# Patient Record
Sex: Male | Born: 1950 | Race: White | Hispanic: No | Marital: Married | Smoking: Former smoker
Health system: Southern US, Community
[De-identification: ages and names within clinical notes are randomized; demographics above are authoritative.]

## PROBLEM LIST (undated history)

## (undated) DIAGNOSIS — K219 Gastro-esophageal reflux disease without esophagitis: Secondary | ICD-10-CM

## (undated) DIAGNOSIS — K5790 Diverticulosis of intestine, part unspecified, without perforation or abscess without bleeding: Secondary | ICD-10-CM

## (undated) DIAGNOSIS — G4489 Other headache syndrome: Principal | ICD-10-CM

## (undated) DIAGNOSIS — H269 Unspecified cataract: Secondary | ICD-10-CM

## (undated) DIAGNOSIS — T7840XA Allergy, unspecified, initial encounter: Secondary | ICD-10-CM

## (undated) DIAGNOSIS — K2970 Gastritis, unspecified, without bleeding: Secondary | ICD-10-CM

## (undated) DIAGNOSIS — M1990 Unspecified osteoarthritis, unspecified site: Secondary | ICD-10-CM

## (undated) DIAGNOSIS — K449 Diaphragmatic hernia without obstruction or gangrene: Secondary | ICD-10-CM

## (undated) HISTORY — DX: Gastritis, unspecified, without bleeding: K29.70

## (undated) HISTORY — PX: HERNIA REPAIR: SHX51

## (undated) HISTORY — DX: Other headache syndrome: G44.89

## (undated) HISTORY — DX: Diverticulosis of intestine, part unspecified, without perforation or abscess without bleeding: K57.90

## (undated) HISTORY — PX: UPPER GASTROINTESTINAL ENDOSCOPY: SHX188

## (undated) HISTORY — DX: Unspecified cataract: H26.9

## (undated) HISTORY — DX: Diaphragmatic hernia without obstruction or gangrene: K44.9

## (undated) HISTORY — PX: APPENDECTOMY: SHX54

## (undated) HISTORY — PX: COLONOSCOPY: SHX174

## (undated) HISTORY — DX: Gastro-esophageal reflux disease without esophagitis: K21.9

## (undated) HISTORY — DX: Allergy, unspecified, initial encounter: T78.40XA

## (undated) HISTORY — DX: Unspecified osteoarthritis, unspecified site: M19.90

---
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
## 2004-12-07 ENCOUNTER — Ambulatory Visit (HOSPITAL_COMMUNITY): Admission: RE | Admit: 2004-12-07 | Discharge: 2004-12-07 | Payer: Self-pay | Admitting: Family Medicine

## 2011-02-03 HISTORY — PX: FACIAL FRACTURE SURGERY: SHX1570

## 2011-02-14 ENCOUNTER — Emergency Department (HOSPITAL_COMMUNITY)
Admission: EM | Admit: 2011-02-14 | Discharge: 2011-02-15 | Disposition: A | Discharge status: Home / Self Care | Payer: PRIVATE HEALTH INSURANCE | Attending: Emergency Medicine | Admitting: Emergency Medicine

## 2011-02-14 DIAGNOSIS — R51 Headache: Secondary | ICD-10-CM | POA: Insufficient documentation

## 2011-02-14 DIAGNOSIS — S0120XA Unspecified open wound of nose, initial encounter: Secondary | ICD-10-CM | POA: Insufficient documentation

## 2011-02-14 DIAGNOSIS — S02109A Fracture of base of skull, unspecified side, initial encounter for closed fracture: Secondary | ICD-10-CM | POA: Insufficient documentation

## 2011-02-14 DIAGNOSIS — S022XXA Fracture of nasal bones, initial encounter for closed fracture: Secondary | ICD-10-CM | POA: Insufficient documentation

## 2011-02-15 ENCOUNTER — Emergency Department (HOSPITAL_COMMUNITY): Payer: PRIVATE HEALTH INSURANCE

## 2011-02-18 ENCOUNTER — Encounter (HOSPITAL_BASED_OUTPATIENT_CLINIC_OR_DEPARTMENT_OTHER)
Admission: RE | Admit: 2011-02-18 | Discharge: 2011-02-18 | Disposition: A | Discharge status: Home / Self Care | Payer: PRIVATE HEALTH INSURANCE | Source: Ambulatory Visit | Attending: Otolaryngology | Admitting: Otolaryngology

## 2011-02-18 DIAGNOSIS — Z01818 Encounter for other preprocedural examination: Secondary | ICD-10-CM | POA: Insufficient documentation

## 2011-02-18 DIAGNOSIS — Z01812 Encounter for preprocedural laboratory examination: Secondary | ICD-10-CM | POA: Insufficient documentation

## 2011-02-18 DIAGNOSIS — Z01810 Encounter for preprocedural cardiovascular examination: Secondary | ICD-10-CM | POA: Insufficient documentation

## 2011-02-20 ENCOUNTER — Ambulatory Visit (HOSPITAL_BASED_OUTPATIENT_CLINIC_OR_DEPARTMENT_OTHER)
Admission: RE | Admit: 2011-02-20 | Discharge: 2011-02-20 | Disposition: A | Discharge status: Home / Self Care | Payer: PRIVATE HEALTH INSURANCE | Source: Ambulatory Visit | Attending: Otolaryngology | Admitting: Otolaryngology

## 2011-02-20 DIAGNOSIS — S02400A Malar fracture unspecified, initial encounter for closed fracture: Secondary | ICD-10-CM | POA: Insufficient documentation

## 2011-02-20 DIAGNOSIS — J449 Chronic obstructive pulmonary disease, unspecified: Secondary | ICD-10-CM | POA: Insufficient documentation

## 2011-02-20 DIAGNOSIS — Y998 Other external cause status: Secondary | ICD-10-CM | POA: Insufficient documentation

## 2011-02-20 DIAGNOSIS — Z01810 Encounter for preprocedural cardiovascular examination: Secondary | ICD-10-CM | POA: Insufficient documentation

## 2011-02-20 DIAGNOSIS — S02401A Maxillary fracture, unspecified, initial encounter for closed fracture: Secondary | ICD-10-CM | POA: Insufficient documentation

## 2011-02-20 DIAGNOSIS — Z01812 Encounter for preprocedural laboratory examination: Secondary | ICD-10-CM | POA: Insufficient documentation

## 2011-02-20 DIAGNOSIS — Y9355 Activity, bike riding: Secondary | ICD-10-CM | POA: Insufficient documentation

## 2011-02-20 DIAGNOSIS — J4489 Other specified chronic obstructive pulmonary disease: Secondary | ICD-10-CM | POA: Insufficient documentation

## 2011-02-20 DIAGNOSIS — K219 Gastro-esophageal reflux disease without esophagitis: Secondary | ICD-10-CM | POA: Insufficient documentation

## 2011-02-20 DIAGNOSIS — Z87891 Personal history of nicotine dependence: Secondary | ICD-10-CM | POA: Insufficient documentation
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
## 2011-12-03 ENCOUNTER — Encounter: Payer: Self-pay | Admitting: Gastroenterology

## 2011-12-13 ENCOUNTER — Ambulatory Visit (AMBULATORY_SURGERY_CENTER): Payer: BC Managed Care – PPO | Admitting: *Deleted

## 2011-12-13 ENCOUNTER — Encounter: Payer: Self-pay | Admitting: Gastroenterology

## 2011-12-13 VITALS — Ht 72.0 in | Wt 190.0 lb

## 2011-12-13 DIAGNOSIS — Z1211 Encounter for screening for malignant neoplasm of colon: Secondary | ICD-10-CM

## 2011-12-13 MED ORDER — PEG-KCL-NACL-NASULF-NA ASC-C 100 G PO SOLR: ORAL | Status: DC

## 2011-12-23 ENCOUNTER — Ambulatory Visit (AMBULATORY_SURGERY_CENTER): Payer: BC Managed Care – PPO | Admitting: Gastroenterology

## 2011-12-23 ENCOUNTER — Encounter: Payer: Self-pay | Admitting: Gastroenterology

## 2011-12-23 VITALS — BP 132/69 | HR 55 | Temp 97.6°F | Resp 12 | Ht 73.0 in | Wt 190.0 lb

## 2011-12-23 DIAGNOSIS — Z1211 Encounter for screening for malignant neoplasm of colon: Secondary | ICD-10-CM

## 2011-12-23 DIAGNOSIS — K5730 Diverticulosis of large intestine without perforation or abscess without bleeding: Secondary | ICD-10-CM

## 2011-12-23 DIAGNOSIS — Z86010 Personal history of colonic polyps: Secondary | ICD-10-CM

## 2011-12-23 MED ORDER — SODIUM CHLORIDE 0.9 % IV SOLN: 500.0000 mL | INTRAVENOUS | Status: DC

## 2011-12-23 NOTE — Progress Notes (Signed)
YOU HAD AN ENDOSCOPIC PROCEDURE TODAY AT THE [facility]: Refer to the procedure report that was given to you for any specific questions about what was found during the examination.  If the procedure report does not answer your questions, please call your gastroenterologist to clarify.  If you requested that your care partner not be given the details of your procedure findings, then the procedure report has been included in a sealed envelope for you to review at your convenience later.  YOU SHOULD EXPECT: Some feelings of bloating in the abdomen. Passage of more gas than usual.  Walking can help get rid of the air that was put into your GI tract during the procedure and reduce the bloating. If you had a lower endoscopy (such as a colonoscopy or flexible sigmoidoscopy) you may notice spotting of blood in your stool or on the toilet paper. If you underwent a bowel prep for your procedure, then you may not have a normal bowel movement for a few days.  DIET: Your first meal following the procedure should be a light meal and then it is ok to progress to your normal diet.  A half-sandwich or bowl of soup is an example of a good first meal.  Heavy or fried foods are harder to digest and may make you feel nauseous or bloated.  Likewise meals heavy in dairy and vegetables can cause extra gas to form and this can also increase the bloating.  Drink plenty of fluids but you should avoid alcoholic beverages for 24 hours.  ACTIVITY: Your care partner should take you home directly after the procedure.  You should plan to take it easy, moving slowly for the rest of the day.  You can resume normal activity the day after the procedure however you should NOT DRIVE or use heavy machinery for 24 hours (because of the sedation medicines used during the test).    SYMPTOMS TO REPORT IMMEDIATELY: A gastroenterologist can be reached at any hour.  During normal business hours, 8:30 AM to 5:00 PM Monday through Friday,  call [phone].  After hours and on weekends, please call the GI answering service at [phone] who will take a message and have the physician on call contact you.   Following lower endoscopy (colonoscopy or flexible sigmoidoscopy):  Excessive amounts of blood in the stool  Significant tenderness or worsening of abdominal pains  Swelling of the abdomen that is new, acute  Fever of 100F or higher  Following upper endoscopy (EGD)  Vomiting of blood or coffee ground material  New chest pain or pain under the shoulder blades  Painful or persistently difficult swallowing  New shortness of breath  Fever of 100F or higher  Black, tarry-looking stools  FOLLOW UP: If any biopsies were taken you will be contacted by phone or by letter within the next 1-3 weeks.  Call your gastroenterologist if you have not heard about the biopsies in 3 weeks.  Our staff will call the home number listed on your records the next business day following your procedure to check on you and address any questions or concerns that you may have at that time regarding the information given to you following your procedure. This is a courtesy call and so if there is no answer at the home number and we have not heard from you through the emergency physician on call, we will assume that you have returned to your regular daily activities without incident.  SIGNATURES/CONFIDENTIALITY: You and/or your care   partner have signed paperwork which will be entered into your electronic medical record.  These signatures attest to the fact that that the information above on your After Visit Summary has been reviewed and is understood.  Full responsibility of the confidentiality of this discharge information lies with you and/or your care-partner.  

## 2011-12-23 NOTE — Progress Notes (Signed)
Patient did not have preoperative order for IV antibiotic SSI prophylaxis. (G8918)  Patient did not experience any of the following events: a burn prior to discharge; a fall within the facility; wrong site/side/patient/procedure/implant event; or a hospital transfer or hospital admission upon discharge from the facility. (G8907)  

## 2011-12-23 NOTE — Op Note (Signed)
[facility] [address] [place], [place]  [address]  COLONOSCOPY PROCEDURE REPORT  PATIENT:  Donald Welch, Donald Welch  MR#:  [id number] BIRTHDATE:  January 06, 1951, 60 yrs. old  GENDER:  male ENDOSCOPIST:  Vania Rea. Jarold Motto, MD, [facility] REF. BY: PROCEDURE DATE:  12/23/2011 PROCEDURE:  Surveillance Colonoscopy ASA CLASS:  Class I INDICATIONS:  history of pre-cancerous (adenomatous) colon polyps  MEDICATIONS:   propofol (Diprivan) 240 mg IV  DESCRIPTION OF PROCEDURE:   After the risks and benefits and of the procedure were explained, informed consent was obtained. Digital rectal exam was performed and revealed no abnormalities. The LB CF-[id number] endoscope was introduced through the anus and advanced to the cecum, which was identified by both the appendix and ileocecal valve.  The quality of the prep was excellent, using MoviPrep.  The instrument was then slowly withdrawn as the colon was fully examined. <<PROCEDUREIMAGES>>  FINDINGS:  There were mild diverticular changes in left colon. diverticulosis was found.  No polyps or cancers were seen.  This was otherwise a normal examination of the colon.   Retroflexed views in the rectum revealed no abnormalities.    The scope was then withdrawn from the patient and the procedure completed.  COMPLICATIONS:  None ENDOSCOPIC IMPRESSION: 1) Diverticulosis,mild,left sided diverticulosis 2) No polyps or cancers 3) Otherwise normal examination RECOMMENDATIONS: 1) Repeat Colonscopy in 10 years. 2) High fiber diet  REPEAT EXAM:  No  ______________________________ Vania Rea. Jarold Motto, MD, Clementeen Graham  CC:  n. eSIGNED:   Vania Rea. Patterson at 12/23/2011 11:50 AM  Otilio Saber, [phone]

## 2011-12-23 NOTE — Patient Instructions (Signed)
YOU HAD AN ENDOSCOPIC PROCEDURE TODAY AT THE [facility]: Refer to the procedure report that was given to you for any specific questions about what was found during the examination.  If the procedure report does not answer your questions, please call your gastroenterologist to clarify.  If you requested that your care partner not be given the details of your procedure findings, then the procedure report has been included in a sealed envelope for you to review at your convenience later.  YOU SHOULD EXPECT: Some feelings of bloating in the abdomen. Passage of more gas than usual.  Walking can help get rid of the air that was put into your GI tract during the procedure and reduce the bloating. If you had a lower endoscopy (such as a colonoscopy or flexible sigmoidoscopy) you may notice spotting of blood in your stool or on the toilet paper. If you underwent a bowel prep for your procedure, then you may not have a normal bowel movement for a few days.  DIET: Your first meal following the procedure should be a light meal and then it is ok to progress to your normal diet.  A half-sandwich or bowl of soup is an example of a good first meal.  Heavy or fried foods are harder to digest and may make you feel nauseous or bloated.  Likewise meals heavy in dairy and vegetables can cause extra gas to form and this can also increase the bloating.  Drink plenty of fluids but you should avoid alcoholic beverages for 24 hours.  ACTIVITY: Your care partner should take you home directly after the procedure.  You should plan to take it easy, moving slowly for the rest of the day.  You can resume normal activity the day after the procedure however you should NOT DRIVE or use heavy machinery for 24 hours (because of the sedation medicines used during the test).    SYMPTOMS TO REPORT IMMEDIATELY: A gastroenterologist can be reached at any hour.  During normal business hours, 8:30 AM to 5:00 PM Monday through Friday,  call [phone].  After hours and on weekends, please call the GI answering service at [phone] who will take a message and have the physician on call contact you.   Following lower endoscopy (colonoscopy or flexible sigmoidoscopy):  Excessive amounts of blood in the stool  Significant tenderness or worsening of abdominal pains  Swelling of the abdomen that is new, acute  Fever of 100F or higher  Following upper endoscopy (EGD)  Vomiting of blood or coffee ground material  New chest pain or pain under the shoulder blades  Painful or persistently difficult swallowing  New shortness of breath  Fever of 100F or higher  Black, tarry-looking stools  FOLLOW UP: If any biopsies were taken you will be contacted by phone or by letter within the next 1-3 weeks.  Call your gastroenterologist if you have not heard about the biopsies in 3 weeks.  Our staff will call the home number listed on your records the next business day following your procedure to check on you and address any questions or concerns that you may have at that time regarding the information given to you following your procedure. This is a courtesy call and so if there is no answer at the home number and we have not heard from you through the emergency physician on call, we will assume that you have returned to your regular daily activities without incident.  SIGNATURES/CONFIDENTIALITY: You and/or your care   partner have signed paperwork which will be entered into your electronic medical record.  These signatures attest to the fact that that the information above on your After Visit Summary has been reviewed and is understood.  Full responsibility of the confidentiality of this discharge information lies with you and/or your care-partner.  

## 2011-12-24 ENCOUNTER — Telehealth: Payer: Self-pay | Admitting: *Deleted

## 2011-12-24 NOTE — Telephone Encounter (Signed)
  Follow up Call-  Call back number 12/23/2011  Post procedure Call Back phone  # [phone]  Permission to leave phone message Yes     Patient questions:  Do you have a fever, pain , or abdominal swelling? no Pain Score  0 *  Have you tolerated food without any problems? yes  Have you been able to return to your normal activities? yes  Do you have any questions about your discharge instructions: Diet   no Medications  no Follow up visit  no  Do you have questions or concerns about your Care? no  Actions: * If pain score is 4 or above: No action needed, pain <4.

## 2014-06-21 DIAGNOSIS — L723 Sebaceous cyst: Secondary | ICD-10-CM | POA: Diagnosis not present

## 2014-06-21 DIAGNOSIS — D1801 Hemangioma of skin and subcutaneous tissue: Secondary | ICD-10-CM | POA: Diagnosis not present

## 2014-06-21 DIAGNOSIS — L570 Actinic keratosis: Secondary | ICD-10-CM | POA: Diagnosis not present

## 2014-06-21 DIAGNOSIS — L821 Other seborrheic keratosis: Secondary | ICD-10-CM | POA: Diagnosis not present

## 2014-10-17 DIAGNOSIS — J329 Chronic sinusitis, unspecified: Secondary | ICD-10-CM | POA: Diagnosis not present

## 2014-12-19 DIAGNOSIS — Z125 Encounter for screening for malignant neoplasm of prostate: Secondary | ICD-10-CM | POA: Diagnosis not present

## 2014-12-19 DIAGNOSIS — M1990 Unspecified osteoarthritis, unspecified site: Secondary | ICD-10-CM | POA: Diagnosis not present

## 2014-12-19 DIAGNOSIS — Z0000 Encounter for general adult medical examination without abnormal findings: Secondary | ICD-10-CM | POA: Diagnosis not present

## 2014-12-19 DIAGNOSIS — R001 Bradycardia, unspecified: Secondary | ICD-10-CM | POA: Diagnosis not present

## 2014-12-19 DIAGNOSIS — Z23 Encounter for immunization: Secondary | ICD-10-CM | POA: Diagnosis not present

## 2014-12-19 DIAGNOSIS — E785 Hyperlipidemia, unspecified: Secondary | ICD-10-CM | POA: Diagnosis not present

## 2014-12-21 ENCOUNTER — Other Ambulatory Visit: Payer: Self-pay | Admitting: Family Medicine

## 2014-12-21 DIAGNOSIS — Z139 Encounter for screening, unspecified: Secondary | ICD-10-CM

## 2014-12-26 ENCOUNTER — Ambulatory Visit
Admission: RE | Admit: 2014-12-26 | Discharge: 2014-12-26 | Disposition: A | Discharge status: Home / Self Care | Payer: Medicare Other | Source: Ambulatory Visit | Attending: Family Medicine | Admitting: Family Medicine

## 2014-12-26 DIAGNOSIS — Z139 Encounter for screening, unspecified: Secondary | ICD-10-CM

## 2014-12-26 DIAGNOSIS — Z136 Encounter for screening for cardiovascular disorders: Secondary | ICD-10-CM | POA: Diagnosis not present

## 2014-12-26 DIAGNOSIS — Z87891 Personal history of nicotine dependence: Secondary | ICD-10-CM | POA: Diagnosis not present

## 2014-12-27 DIAGNOSIS — R9431 Abnormal electrocardiogram [ECG] [EKG]: Secondary | ICD-10-CM | POA: Diagnosis not present

## 2014-12-27 DIAGNOSIS — R001 Bradycardia, unspecified: Secondary | ICD-10-CM | POA: Diagnosis not present

## 2015-01-09 DIAGNOSIS — R9431 Abnormal electrocardiogram [ECG] [EKG]: Secondary | ICD-10-CM | POA: Diagnosis not present

## 2015-07-20 ENCOUNTER — Emergency Department (HOSPITAL_COMMUNITY): Payer: Medicare Other

## 2015-07-20 ENCOUNTER — Encounter (HOSPITAL_COMMUNITY): Payer: Self-pay | Admitting: Emergency Medicine

## 2015-07-20 ENCOUNTER — Emergency Department (HOSPITAL_COMMUNITY)
Admission: EM | Admit: 2015-07-20 | Discharge: 2015-07-20 | Disposition: A | Discharge status: Home / Self Care | Payer: Medicare Other | Attending: Emergency Medicine | Admitting: Emergency Medicine

## 2015-07-20 DIAGNOSIS — S79912A Unspecified injury of left hip, initial encounter: Secondary | ICD-10-CM | POA: Diagnosis present

## 2015-07-20 DIAGNOSIS — Y9289 Other specified places as the place of occurrence of the external cause: Secondary | ICD-10-CM | POA: Insufficient documentation

## 2015-07-20 DIAGNOSIS — S40212A Abrasion of left shoulder, initial encounter: Secondary | ICD-10-CM | POA: Insufficient documentation

## 2015-07-20 DIAGNOSIS — S060X0A Concussion without loss of consciousness, initial encounter: Secondary | ICD-10-CM | POA: Insufficient documentation

## 2015-07-20 DIAGNOSIS — S0081XA Abrasion of other part of head, initial encounter: Secondary | ICD-10-CM | POA: Insufficient documentation

## 2015-07-20 DIAGNOSIS — M25552 Pain in left hip: Secondary | ICD-10-CM | POA: Diagnosis not present

## 2015-07-20 DIAGNOSIS — G454 Transient global amnesia: Secondary | ICD-10-CM | POA: Diagnosis not present

## 2015-07-20 DIAGNOSIS — Y9389 Activity, other specified: Secondary | ICD-10-CM | POA: Diagnosis not present

## 2015-07-20 DIAGNOSIS — Y999 Unspecified external cause status: Secondary | ICD-10-CM | POA: Diagnosis not present

## 2015-07-20 DIAGNOSIS — M1990 Unspecified osteoarthritis, unspecified site: Secondary | ICD-10-CM | POA: Diagnosis not present

## 2015-07-20 DIAGNOSIS — S50312A Abrasion of left elbow, initial encounter: Secondary | ICD-10-CM | POA: Diagnosis not present

## 2015-07-20 DIAGNOSIS — S7002XA Contusion of left hip, initial encounter: Secondary | ICD-10-CM

## 2015-07-20 DIAGNOSIS — Z791 Long term (current) use of non-steroidal anti-inflammatories (NSAID): Secondary | ICD-10-CM | POA: Insufficient documentation

## 2015-07-20 DIAGNOSIS — R51 Headache: Secondary | ICD-10-CM | POA: Diagnosis not present

## 2015-07-20 DIAGNOSIS — Z880 Allergy status to penicillin: Secondary | ICD-10-CM | POA: Insufficient documentation
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
## 2015-07-20 NOTE — Discharge Instructions (Signed)
Bicycling Advice to [employer] cyclists may be cycling for the first time in many years. So they will need to brush up on current laws and rules that relate to bicyclists and sharing the road. Some senior cyclists may simply be continuing a lifelong cycling habit. But as they grow older, they may be faced with physical issues that need new solutions. Senior cyclists should understand the health and environmental benefits of cycling. Senior cyclists may have decades of traffic experience, but they may not be accustomed to the ways that bicycles function in traffic today. A short bicycle course or workshop can be helpful in bringing them up to speed. Bicycles are required to ride on the right side of the road. They ride with traffic, not against it. This may be different than the way many seniors first learned to ride. Seniors must learn that:   This is the current law.  They may be ticketed for riding against traffic. Senior cyclists must learn:  About the various styles of bikes.  Which bike will best suit their needs.  How to select and purchase a properly fitting helmet. Seniors should learn the different ways of carrying cargo and what lights and other accessories, such as a water bottle holder, they may need. They should be introduced to other options, such as gloves and glasses, and how they may benefit from these accessories. The senior cyclist must remember that as a bicyclist, he or she is a Physiological scientist. A cyclist is subject to the same laws as drivers of cars. Senior cyclists must learn to maintain a defensive riding attitude, even when the law and right-of-way are in their favor. They should anticipate what a driver is going to do. But they should not take it for granted that he or she will actually do it. Senior cyclists should never underestimate the importance of good motorist-to-cyclist communication through hand signals and eye contact. Just as they would do when driving a  car, senior cyclists should scan traffic regularly by looking around and behind them as they ride. Some senior adults discover that it becomes more difficult to turn their heads to scan as they grow older. If so, they should have a rearview or side mirror mounted to their bike and learn to use it. The senior cyclist should learn how to make him or herself noticeable to others on the road by wearing bright, reflective clothing. Senior cyclists should learn how to safely navigate their way through intersections and complex traffic situations. They should also be able to recognize and avoid road hazards. Senior cyclists should explore the "science" of good route selection, and take advantage of bike lanes, bike routes, and multi-use paths. City bicycling maps are generally available at bike shops. These maps are useful in finding such routes. Document Released: 01/11/2004 Document Revised: 03/07/2014 Document Reviewed: 09/14/2008 [facility] Patient Information 2015 [place], [place]. This information is not intended to replace advice given to you by your health care provider. Make sure you discuss any questions you have with your health care provider.  Concussion A concussion, or closed-head injury, is a brain injury caused by a direct blow to the head or by a quick and sudden movement (jolt) of the head or neck. Concussions are usually not life-threatening. Even so, the effects of a concussion can be serious. If you have had a concussion before, you are more likely to experience concussion-like symptoms after a direct blow to the head.  CAUSES  Direct blow to the head,  such as from running into another player during a soccer game, being hit in a fight, or hitting your head on a hard surface.  A jolt of the head or neck that causes the brain to move back and forth inside the skull, such as in a car crash. SIGNS AND SYMPTOMS The signs of a concussion can be hard to notice. Early on, they may be missed by you,  family members, and health care providers. You may look fine but act or feel differently. Symptoms are usually temporary, but they may last for days, weeks, or even longer. Some symptoms may appear right away while others may not show up for hours or days. Every head injury is different. Symptoms include:  Mild to moderate headaches that will not go away.  A feeling of pressure inside your head.  Having more trouble than usual:  Learning or remembering things you have heard.  Answering questions.  Paying attention or concentrating.  Organizing daily tasks.  Making decisions and solving problems.  Slowness in thinking, acting or reacting, speaking, or reading.  Getting lost or being easily confused.  Feeling tired all the time or lacking energy (fatigued).  Feeling drowsy.  Sleep disturbances.  Sleeping more than usual.  Sleeping less than usual.  Trouble falling asleep.  Trouble sleeping (insomnia).  Loss of balance or feeling lightheaded or dizzy.  Nausea or vomiting.  Numbness or tingling.  Increased sensitivity to:  Sounds.  Lights.  Distractions.  Vision problems or eyes that tire easily.  Diminished sense of taste or smell.  Ringing in the ears.  Mood changes such as feeling sad or anxious.  Becoming easily irritated or angry for little or no reason.  Lack of motivation.  Seeing or hearing things other people do not see or hear (hallucinations). DIAGNOSIS Your health care provider can usually diagnose a concussion based on a description of your injury and symptoms. He or she will ask whether you passed out (lost consciousness) and whether you are having trouble remembering events that happened right before and during your injury. Your evaluation might include:  A brain scan to look for signs of injury to the brain. Even if the test shows no injury, you may still have a concussion.  Blood tests to be sure other problems are not  present. TREATMENT  Concussions are usually treated in an emergency department, in urgent care, or at a clinic. You may need to stay in the hospital overnight for further treatment.  Tell your health care provider if you are taking any medicines, including prescription medicines, over-the-counter medicines, and natural remedies. Some medicines, such as blood thinners (anticoagulants) and aspirin, may increase the chance of complications. Also tell your health care provider whether you have had alcohol or are taking illegal drugs. This information may affect treatment.  Your health care provider will send you home with important instructions to follow.  How fast you will recover from a concussion depends on many factors. These factors include how severe your concussion is, what part of your brain was injured, your age, and how healthy you were before the concussion.  Most people with mild injuries recover fully. Recovery can take time. In general, recovery is slower in older persons. Also, persons who have had a concussion in the past or have other medical problems may find that it takes longer to recover from their current injury. HOME CARE INSTRUCTIONS General Instructions  Carefully follow the directions your health care provider gave you.  Only take  over-the-counter or prescription medicines for pain, discomfort, or fever as directed by your health care provider.  Take only those medicines that your health care provider has approved.  Do not drink alcohol until your health care provider says you are well enough to do so. Alcohol and certain other drugs may slow your recovery and can put you at risk of further injury.  If it is harder than usual to remember things, write them down.  If you are easily distracted, try to do one thing at a time. For example, do not try to watch TV while fixing dinner.  Talk with family members or close friends when making important decisions.  Keep all  follow-up appointments. Repeated evaluation of your symptoms is recommended for your recovery.  Watch your symptoms and tell others to do the same. Complications sometimes occur after a concussion. Older adults with a brain injury may have a higher risk of serious complications, such as a blood clot on the brain.  Tell your teachers, school nurse, school counselor, coach, athletic trainer, or work Freight forwarder about your injury, symptoms, and restrictions. Tell them about what you can or cannot do. They should watch for:  Increased problems with attention or concentration.  Increased difficulty remembering or learning new information.  Increased time needed to complete tasks or assignments.  Increased irritability or decreased ability to cope with stress.  Increased symptoms.  Rest. Rest helps the brain to heal. Make sure you:  Get plenty of sleep at night. Avoid staying up late at night.  Keep the same bedtime hours on weekends and weekdays.  Rest during the day. Take daytime naps or rest breaks when you feel tired.  Limit activities that require a lot of thought or concentration. These include:  Doing homework or job-related work.  Watching TV.  Working on the computer.  Avoid any situation where there is potential for another head injury (football, hockey, soccer, basketball, martial arts, downhill snow sports and horseback riding). Your condition will get worse every time you experience a concussion. You should avoid these activities until you are evaluated by the appropriate follow-up health care providers. Returning To Your Regular Activities You will need to return to your normal activities slowly, not all at once. You must give your body and brain enough time for recovery.  Do not return to sports or other athletic activities until your health care provider tells you it is safe to do so.  Ask your health care provider when you can drive, ride a bicycle, or operate heavy  machinery. Your ability to react may be slower after a brain injury. Never do these activities if you are dizzy.  Ask your health care provider about when you can return to work or school. Preventing Another Concussion It is very important to avoid another brain injury, especially before you have recovered. In rare cases, another injury can lead to permanent brain damage, brain swelling, or death. The risk of this is greatest during the first 7-10 days after a head injury. Avoid injuries by:  Wearing a seat belt when riding in a car.  Drinking alcohol only in moderation.  Wearing a helmet when biking, skiing, skateboarding, skating, or doing similar activities.  Avoiding activities that could lead to a second concussion, such as contact or recreational sports, until your health care provider says it is okay.  Taking safety measures in your home.  Remove clutter and tripping hazards from floors and stairways.  Use grab bars in bathrooms and handrails  by stairs.  Place non-slip mats on floors and in bathtubs.  Improve lighting in dim areas. SEEK MEDICAL CARE IF:  You have increased problems paying attention or concentrating.  You have increased difficulty remembering or learning new information.  You need more time to complete tasks or assignments than before.  You have increased irritability or decreased ability to cope with stress.  You have more symptoms than before. Seek medical care if you have any of the following symptoms for more than 2 weeks after your injury:  Lasting (chronic) headaches.  Dizziness or balance problems.  Nausea.  Vision problems.  Increased sensitivity to noise or light.  Depression or mood swings.  Anxiety or irritability.  Memory problems.  Difficulty concentrating or paying attention.  Sleep problems.  Feeling tired all the time. SEEK IMMEDIATE MEDICAL CARE IF:  You have severe or worsening headaches. These may be a sign of a  blood clot in the brain.  You have weakness (even if only in one hand, leg, or part of the face).  You have numbness.  You have decreased coordination.  You vomit repeatedly.  You have increased sleepiness.  One pupil is larger than the other.  You have convulsions.  You have slurred speech.  You have increased confusion. This may be a sign of a blood clot in the brain.  You have increased restlessness, agitation, or irritability.  You are unable to recognize people or places.  You have neck pain.  It is difficult to wake you up.  You have unusual behavior changes.  You lose consciousness. MAKE SURE YOU:  Understand these instructions.  Will watch your condition.  Will get help right away if you are not doing well or get worse. Document Released: 01/11/2004 Document Revised: 10/26/2013 Document Reviewed: 05/13/2013 [facility] Patient Information 2015 [place], [place]. This information is not intended to replace advice given to you by your health care provider. Make sure you discuss any questions you have with your health care provider.  Hematoma A hematoma is a collection of blood under the skin, in an organ, in a body space, in a joint space, or in other tissue. The blood can clot to form a lump that you can see and feel. The lump is often firm and may sometimes become sore and tender. Most hematomas get better in a few days to weeks. However, some hematomas may be serious and require medical care. Hematomas can range in size from very small to very large. CAUSES  A hematoma can be caused by a blunt or penetrating injury. It can also be caused by spontaneous leakage from a blood vessel under the skin. Spontaneous leakage from a blood vessel is more likely to occur in older people, especially those taking blood thinners. Sometimes, a hematoma can develop after certain medical procedures. SIGNS AND SYMPTOMS   A firm lump on the body.  Possible pain and tenderness in the  area.  Bruising.Blue, dark blue, purple-red, or yellowish skin may appear at the site of the hematoma if the hematoma is close to the surface of the skin. For hematomas in deeper tissues or body spaces, the signs and symptoms may be subtle. For example, an intra-abdominal hematoma may cause abdominal pain, weakness, fainting, and shortness of breath. An intracranial hematoma may cause a headache or symptoms such as weakness, trouble speaking, or a change in consciousness. DIAGNOSIS  A hematoma can usually be diagnosed based on your medical history and a physical exam. Imaging tests may be needed  if your health care provider suspects a hematoma in deeper tissues or body spaces, such as the abdomen, head, or chest. These tests may include ultrasonography or a CT scan.  TREATMENT  Hematomas usually go away on their own over time. Rarely does the blood need to be drained out of the body. Large hematomas or those that may affect vital organs will sometimes need surgical drainage or monitoring. HOME CARE INSTRUCTIONS   Apply ice to the injured area:   Put ice in a plastic bag.   Place a towel between your skin and the bag.   Leave the ice on for 20 minutes, 2-3 times a day for the first 1 to 2 days.   After the first 2 days, switch to using warm compresses on the hematoma.   Elevate the injured area to help decrease pain and swelling. Wrapping the area with an elastic bandage may also be helpful. Compression helps to reduce swelling and promotes shrinking of the hematoma. Make sure the bandage is not wrapped too tight.   If your hematoma is on a lower extremity and is painful, crutches may be helpful for a couple days.   Only take over-the-counter or prescription medicines as directed by your health care provider. SEEK IMMEDIATE MEDICAL CARE IF:   You have increasing pain, or your pain is not controlled with medicine.   You have a fever.   You have worsening swelling or  discoloration.   Your skin over the hematoma breaks or starts bleeding.   Your hematoma is in your chest or abdomen and you have weakness, shortness of breath, or a change in consciousness.  Your hematoma is on your scalp (caused by a fall or injury) and you have a worsening headache or a change in alertness or consciousness. MAKE SURE YOU:   Understand these instructions.  Will watch your condition.  Will get help right away if you are not doing well or get worse. Document Released: 06/04/2004 Document Revised: 06/23/2013 Document Reviewed: 03/31/2013 [facility] Patient Information 2015 [place], [place]. This information is not intended to replace advice given to you by your health care provider. Make sure you discuss any questions you have with your health care provider.

## 2015-07-20 NOTE — ED Notes (Signed)
Pt O2 90% when walking.

## 2015-08-08 DIAGNOSIS — M47812 Spondylosis without myelopathy or radiculopathy, cervical region: Secondary | ICD-10-CM | POA: Diagnosis not present

## 2015-08-08 DIAGNOSIS — S7002XA Contusion of left hip, initial encounter: Secondary | ICD-10-CM | POA: Diagnosis not present

## 2015-08-09 DIAGNOSIS — M47812 Spondylosis without myelopathy or radiculopathy, cervical region: Secondary | ICD-10-CM | POA: Diagnosis not present

## 2015-08-16 DIAGNOSIS — M47812 Spondylosis without myelopathy or radiculopathy, cervical region: Secondary | ICD-10-CM | POA: Diagnosis not present

## 2015-08-22 DIAGNOSIS — M47812 Spondylosis without myelopathy or radiculopathy, cervical region: Secondary | ICD-10-CM | POA: Diagnosis not present

## 2015-08-29 DIAGNOSIS — M47812 Spondylosis without myelopathy or radiculopathy, cervical region: Secondary | ICD-10-CM | POA: Diagnosis not present

## 2015-09-05 DIAGNOSIS — M47812 Spondylosis without myelopathy or radiculopathy, cervical region: Secondary | ICD-10-CM | POA: Diagnosis not present

## 2015-09-06 DIAGNOSIS — M542 Cervicalgia: Secondary | ICD-10-CM | POA: Diagnosis not present

## 2015-09-06 DIAGNOSIS — M76892 Other specified enthesopathies of left lower limb, excluding foot: Secondary | ICD-10-CM | POA: Diagnosis not present

## 2015-09-13 DIAGNOSIS — M47812 Spondylosis without myelopathy or radiculopathy, cervical region: Secondary | ICD-10-CM | POA: Diagnosis not present

## 2015-10-03 DIAGNOSIS — M47812 Spondylosis without myelopathy or radiculopathy, cervical region: Secondary | ICD-10-CM | POA: Diagnosis not present

## 2015-10-10 DIAGNOSIS — M47812 Spondylosis without myelopathy or radiculopathy, cervical region: Secondary | ICD-10-CM | POA: Diagnosis not present

## 2015-10-11 DIAGNOSIS — B351 Tinea unguium: Secondary | ICD-10-CM | POA: Diagnosis not present

## 2015-10-11 DIAGNOSIS — L821 Other seborrheic keratosis: Secondary | ICD-10-CM | POA: Diagnosis not present

## 2015-10-11 DIAGNOSIS — L570 Actinic keratosis: Secondary | ICD-10-CM | POA: Diagnosis not present

## 2015-10-11 DIAGNOSIS — L820 Inflamed seborrheic keratosis: Secondary | ICD-10-CM | POA: Diagnosis not present

## 2015-10-11 DIAGNOSIS — L578 Other skin changes due to chronic exposure to nonionizing radiation: Secondary | ICD-10-CM | POA: Diagnosis not present

## 2015-10-12 DIAGNOSIS — M47812 Spondylosis without myelopathy or radiculopathy, cervical region: Secondary | ICD-10-CM | POA: Diagnosis not present

## 2015-10-17 DIAGNOSIS — M47812 Spondylosis without myelopathy or radiculopathy, cervical region: Secondary | ICD-10-CM | POA: Diagnosis not present

## 2015-10-19 DIAGNOSIS — M47812 Spondylosis without myelopathy or radiculopathy, cervical region: Secondary | ICD-10-CM | POA: Diagnosis not present
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
## 2015-10-24 DIAGNOSIS — M47812 Spondylosis without myelopathy or radiculopathy, cervical region: Secondary | ICD-10-CM | POA: Diagnosis not present

## 2015-10-26 DIAGNOSIS — M47812 Spondylosis without myelopathy or radiculopathy, cervical region: Secondary | ICD-10-CM | POA: Diagnosis not present

## 2015-12-25 DIAGNOSIS — E785 Hyperlipidemia, unspecified: Secondary | ICD-10-CM | POA: Diagnosis not present

## 2015-12-25 DIAGNOSIS — Z0000 Encounter for general adult medical examination without abnormal findings: Secondary | ICD-10-CM | POA: Diagnosis not present

## 2015-12-25 DIAGNOSIS — Z23 Encounter for immunization: Secondary | ICD-10-CM | POA: Diagnosis not present

## 2015-12-25 DIAGNOSIS — Z125 Encounter for screening for malignant neoplasm of prostate: Secondary | ICD-10-CM | POA: Diagnosis not present

## 2016-03-14 DIAGNOSIS — H2513 Age-related nuclear cataract, bilateral: Secondary | ICD-10-CM | POA: Diagnosis not present

## 2016-05-08 ENCOUNTER — Ambulatory Visit: Payer: Medicare Other | Admitting: Neurology

## 2016-05-31 ENCOUNTER — Ambulatory Visit: Payer: Medicare Other | Admitting: Neurology

## 2016-06-04 ENCOUNTER — Encounter: Payer: Self-pay | Admitting: Neurology

## 2016-06-04 ENCOUNTER — Ambulatory Visit (INDEPENDENT_AMBULATORY_CARE_PROVIDER_SITE_OTHER): Payer: Medicare Other | Admitting: Neurology

## 2016-06-04 VITALS — BP 122/60 | HR 62 | Ht 72.0 in | Wt 189.0 lb

## 2016-06-04 DIAGNOSIS — G44219 Episodic tension-type headache, not intractable: Secondary | ICD-10-CM

## 2016-06-04 DIAGNOSIS — Z87820 Personal history of traumatic brain injury: Secondary | ICD-10-CM | POA: Diagnosis not present

## 2016-06-04 DIAGNOSIS — M47812 Spondylosis without myelopathy or radiculopathy, cervical region: Secondary | ICD-10-CM

## 2016-06-04 DIAGNOSIS — M4692 Unspecified inflammatory spondylopathy, cervical region: Secondary | ICD-10-CM | POA: Diagnosis not present

## 2016-06-04 NOTE — Patient Instructions (Signed)
I think you just have some tension headaches.  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 1.  Contact us if you would like referral to Dr. Hulan Saas, a Sports Medicine doctor who treats neck pain 2.  For headache, consider the following supplements: Magnesium oxide 400mg  to 600mg  daily, riboflavin 400mg , Coenzyme Q 10 100mg  three times daily 3.  If you would like further evaluation for memory, please make a follow up appointment.
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
## 2016-11-07 DIAGNOSIS — H5711 Ocular pain, right eye: Secondary | ICD-10-CM | POA: Diagnosis not present

## 2016-12-31 DIAGNOSIS — E785 Hyperlipidemia, unspecified: Secondary | ICD-10-CM | POA: Diagnosis not present

## 2016-12-31 DIAGNOSIS — Z125 Encounter for screening for malignant neoplasm of prostate: Secondary | ICD-10-CM | POA: Diagnosis not present

## 2016-12-31 DIAGNOSIS — Z5181 Encounter for therapeutic drug level monitoring: Secondary | ICD-10-CM | POA: Diagnosis not present

## 2016-12-31 DIAGNOSIS — Z0000 Encounter for general adult medical examination without abnormal findings: Secondary | ICD-10-CM | POA: Diagnosis not present

## 2017-01-02 ENCOUNTER — Other Ambulatory Visit: Payer: Self-pay | Admitting: Family Medicine

## 2017-01-02 DIAGNOSIS — S0990XD Unspecified injury of head, subsequent encounter: Secondary | ICD-10-CM

## 2017-01-07 ENCOUNTER — Ambulatory Visit
Admission: RE | Admit: 2017-01-07 | Discharge: 2017-01-07 | Disposition: A | Discharge status: Home / Self Care | Payer: Medicare Other | Source: Ambulatory Visit | Attending: Family Medicine | Admitting: Family Medicine

## 2017-01-07 DIAGNOSIS — S0990XA Unspecified injury of head, initial encounter: Secondary | ICD-10-CM | POA: Diagnosis not present

## 2017-01-07 DIAGNOSIS — S0990XD Unspecified injury of head, subsequent encounter: Secondary | ICD-10-CM

## 2017-01-07 MED ORDER — IOPAMIDOL (ISOVUE-300) INJECTION 61%
75.0000 mL | Freq: Once | INTRAVENOUS | Status: AC | PRN
  Administered 2017-01-07: 75 mL via INTRAVENOUS

## 2017-01-16 ENCOUNTER — Ambulatory Visit (INDEPENDENT_AMBULATORY_CARE_PROVIDER_SITE_OTHER): Payer: Medicare Other | Admitting: Neurology

## 2017-01-16 ENCOUNTER — Telehealth: Payer: Self-pay | Admitting: Neurology

## 2017-01-16 ENCOUNTER — Encounter: Payer: Self-pay | Admitting: Neurology

## 2017-01-16 DIAGNOSIS — G4489 Other headache syndrome: Secondary | ICD-10-CM | POA: Diagnosis not present

## 2017-01-16 HISTORY — DX: Other headache syndrome: G44.89

## 2017-01-16 MED ORDER — TOPIRAMATE 25 MG PO TABS: ORAL_TABLET | ORAL | 3 refills | Status: DC
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
## 2017-01-16 NOTE — Telephone Encounter (Signed)
Pt needs bilateral US carotid scheduled per [facility].

## 2017-01-16 NOTE — Patient Instructions (Signed)
   We will get a carotid doppler study and blood work today.  Topamax (topiramate) is a seizure medication that has an FDA approval for seizures and for migraine headache. Potential side effects of this medication include weight loss, cognitive slowing, tingling in the fingers and toes, and carbonated drinks will taste bad. If any significant side effects are noted on this drug, please contact our office.

## 2017-01-17 LAB — SEDIMENTATION RATE: SED RATE: 5 mm/h (ref 0–30)

## 2017-01-20 ENCOUNTER — Telehealth: Payer: Self-pay | Admitting: *Deleted

## 2017-01-20 NOTE — Telephone Encounter (Signed)
-----   Message from Kathrynn Ducking, MD sent at 01/17/2017  7:25 AM EDT -----  The blood work results are unremarkable. Please call the patient.  ----- Message ----- From: Lavone Neri Lab Results In Sent: 01/17/2017   5:41 AM To: Kathrynn Ducking, MD

## 2017-01-20 NOTE — Telephone Encounter (Signed)
Called and spoke with pt about unremarkable labs per CW,MD note. He verbalized understanding.   He asked about scheduling US doppler. Advised scheduler has been out sick. He will be called hopefully this week to get scheduled. He verbalized understanding.

## 2017-01-22 ENCOUNTER — Ambulatory Visit (INDEPENDENT_AMBULATORY_CARE_PROVIDER_SITE_OTHER): Payer: Medicare Other

## 2017-01-22 DIAGNOSIS — G4489 Other headache syndrome: Secondary | ICD-10-CM

## 2017-01-27 ENCOUNTER — Telehealth: Payer: Self-pay | Admitting: *Deleted

## 2017-01-27 NOTE — Telephone Encounter (Signed)
I called the patient, left message, I will call back later. 

## 2017-01-27 NOTE — Telephone Encounter (Signed)
I called the patient. The patient has developed right flank pain, he has just started the Topamax. He has a history of kidney stones previously, I'm not sure the Topamax directly is responsible for this new pain.  The patient will come off of Topamax, he will call me when the symptoms resolve, we may give him a trial on Zonegran.

## 2017-01-29 ENCOUNTER — Telehealth: Payer: Self-pay | Admitting: Neurology

## 2017-01-29 DIAGNOSIS — N401 Enlarged prostate with lower urinary tract symptoms: Secondary | ICD-10-CM | POA: Diagnosis not present

## 2017-01-29 DIAGNOSIS — R1084 Generalized abdominal pain: Secondary | ICD-10-CM | POA: Diagnosis not present

## 2017-01-29 DIAGNOSIS — R350 Frequency of micturition: Secondary | ICD-10-CM | POA: Diagnosis not present

## 2017-01-29 NOTE — Telephone Encounter (Signed)
I called patient. The patient developed shingles, he has been seen by a urology doctor today, the flank pain likely had nothing to Topamax, he go back on that medication at some point.  I called about the carotid Doppler study, this study was unremarkable, no evidence of a right carotid dissection.

## 2017-01-29 NOTE — Telephone Encounter (Signed)
Pt called back said he noticed a rash around his waist last night. His wife is Therapist, sports and she said it looks like shingles. He is seeing urologist today to determine if it is. 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

## 2017-03-20 DIAGNOSIS — H2513 Age-related nuclear cataract, bilateral: Secondary | ICD-10-CM | POA: Diagnosis not present

## 2017-03-20 DIAGNOSIS — H00025 Hordeolum internum left lower eyelid: Secondary | ICD-10-CM | POA: Diagnosis not present

## 2017-04-21 ENCOUNTER — Ambulatory Visit: Payer: Medicare Other | Admitting: Neurology

## 2017-05-15 DIAGNOSIS — D2239 Melanocytic nevi of other parts of face: Secondary | ICD-10-CM | POA: Diagnosis not present

## 2017-05-15 DIAGNOSIS — L578 Other skin changes due to chronic exposure to nonionizing radiation: Secondary | ICD-10-CM | POA: Diagnosis not present

## 2017-05-15 DIAGNOSIS — L570 Actinic keratosis: Secondary | ICD-10-CM | POA: Diagnosis not present

## 2017-05-15 DIAGNOSIS — L821 Other seborrheic keratosis: Secondary | ICD-10-CM | POA: Diagnosis not present

## 2017-09-15 DIAGNOSIS — R413 Other amnesia: Secondary | ICD-10-CM | POA: Diagnosis not present

## 2017-12-03 DIAGNOSIS — R413 Other amnesia: Secondary | ICD-10-CM | POA: Diagnosis not present

## 2017-12-17 DIAGNOSIS — R413 Other amnesia: Secondary | ICD-10-CM | POA: Diagnosis not present

## 2018-01-26 DIAGNOSIS — E785 Hyperlipidemia, unspecified: Secondary | ICD-10-CM | POA: Diagnosis not present

## 2018-01-26 DIAGNOSIS — Z0000 Encounter for general adult medical examination without abnormal findings: Secondary | ICD-10-CM | POA: Diagnosis not present

## 2018-01-26 DIAGNOSIS — Z5181 Encounter for therapeutic drug level monitoring: Secondary | ICD-10-CM | POA: Diagnosis not present

## 2018-01-26 DIAGNOSIS — Z125 Encounter for screening for malignant neoplasm of prostate: Secondary | ICD-10-CM | POA: Diagnosis not present

## 2018-01-26 DIAGNOSIS — Z136 Encounter for screening for cardiovascular disorders: Secondary | ICD-10-CM | POA: Diagnosis not present

## 2018-03-26 DIAGNOSIS — H2513 Age-related nuclear cataract, bilateral: Secondary | ICD-10-CM | POA: Diagnosis not present

## 2018-05-26 DIAGNOSIS — L820 Inflamed seborrheic keratosis: Secondary | ICD-10-CM | POA: Diagnosis not present

## 2018-05-26 DIAGNOSIS — L821 Other seborrheic keratosis: Secondary | ICD-10-CM | POA: Diagnosis not present

## 2018-05-26 DIAGNOSIS — L578 Other skin changes due to chronic exposure to nonionizing radiation: Secondary | ICD-10-CM | POA: Diagnosis not present

## 2018-06-02 ENCOUNTER — Encounter: Payer: Self-pay | Admitting: Gastroenterology

## 2018-07-24 ENCOUNTER — Ambulatory Visit (INDEPENDENT_AMBULATORY_CARE_PROVIDER_SITE_OTHER): Payer: Medicare Other | Admitting: Gastroenterology

## 2018-07-24 ENCOUNTER — Encounter: Payer: Self-pay | Admitting: Gastroenterology

## 2018-07-24 VITALS — BP 124/74 | HR 64 | Ht 72.0 in | Wt 191.2 lb

## 2018-07-24 DIAGNOSIS — K219 Gastro-esophageal reflux disease without esophagitis: Secondary | ICD-10-CM

## 2018-07-24 NOTE — Patient Instructions (Signed)
Normal BMI (Body Mass Index- based on height and weight) is between 23 and 30. Your BMI today is Body mass index is 25.94 kg/m. Marland Kitchen Please consider follow up  regarding your BMI with your Primary Care Provider.  Thank you for entrusting me with your care and choosing [facility].  Dr Rush Landmark

## 2018-07-24 NOTE — Progress Notes (Signed)
GASTROENTEROLOGY OUTPATIENT CLINIC VISIT   Primary Care Provider Maurice Small, MD [place] [address] [phone]  Referring Provider Maurice Small, MD [place], [place] [address] [phone]  Patient Profile: Donald Welch is a 67 y.o. male with a pmh significant for headaches, arthritis, prior colonic tubular adenomas (last colonoscopy without any evidence so back on average risk screening), prior history of C. difficile.  The patient presents to the [facility] for an evaluation and management of problem(s) noted below:  Problem List 1. Gastroesophageal reflux disease, esophagitis presence not specified     History of Present Illness: This is the patient's first visit to the [place] outpatient clinic in many years.  He was initially referred for development of pyrosis symptoms which were new in onset.  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  During last few months he began to work more significantly and was straining and lifting heavy pieces of wood and tools.  He began to experience a substernal burning sensation with acid reflux-like taste in his mouth.  He began using Nexium over-the-counter (likely 20 mg) on a daily basis the last 2 months.  Within just a couple of days of the use of his Nexium he had complete resolution of symptoms and subsequently has not had any symptoms of pyrosis since.  He has no dysphasia or odynophagia.  He recalls many years previously having heartburn symptoms but was just concerned due to the onset of the symptoms when he had been doing well so previously.  He has had an upper endoscopic evaluation in 2014 and a colonoscopy in 2010.  GI Review of Systems Positive as above Negative for abdominal pain, nausea, vomiting, coffee-ground emesis, hematemesis, jaundice, change in appetite, early satiety, change in bowel habits, melena, hematochezia  Review  of Systems General: Denies fevers/chills/weight loss HEENT: Denies oral lesions Cardiovascular: Denies chest pain/palpitations Pulmonary: Denies shortness of breath/cough Gastroenterological: See HPI Genitourinary: Denies darkened urine Hematological: Denies easy bruising/bleeding Dermatological: Denies jaundice Psychological: Mood is stable Musculoskeletal: Some new arthralgias as result of his lifting and new retirement work   Medications Current Outpatient Medications  Medication Sig Dispense Refill  . acetaminophen (TYLENOL) 325 MG tablet Take 650 mg by mouth as needed. For headache    . diclofenac (VOLTAREN) 50 MG EC tablet Take 25 mg by mouth as needed. pain     . Esomeprazole Magnesium (NEXIUM 24HR PO) Take by mouth as needed.     No current facility-administered medications for this visit.     Allergies Allergies  Allergen Reactions  . Penicillins     Other reaction(s): Other (See Comments) Childhood allergy  Childhood allergy     Histories Past Medical History:  Diagnosis Date  . Arthritis   . Headache syndrome 01/16/2017   Past Surgical History:  Procedure Laterality Date  . APPENDECTOMY    . FACIAL FRACTURE SURGERY  02/2011   has steel plates   . HERNIA REPAIR  2008 or 2009  . 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 Marital status: Married    Spouse name: Thayer Headings  . Number of children: 2  . Years of education: 54  . Highest education level: Not on file  Occupational History  . Occupation: retired  Scientific laboratory technician  . Financial resource strain: Not on file  . Food insecurity:    Worry: Not on  file    Inability: Not on file  . Transportation needs:    Medical: Not on file    Non-medical: Not on file  Tobacco Use  . Smoking status: Former Research scientist (life sciences)  . Smokeless tobacco: Never Used  Substance and Sexual Activity  . Alcohol use: Yes    Comment: 12 beers per year  . Drug use: No  . Sexual activity: Not on  file  Lifestyle  . Physical activity:    Days per week: Not on file    Minutes per session: Not on file  . Stress: Not on file  Relationships  . Social connections:    Talks on phone: Not on file    Gets together: Not on file    Attends religious service: Not on file    Active member of club or organization: Not on file    Attends meetings of clubs or organizations: Not on file    Relationship status: Not on file  . Intimate partner violence:    Fear of current or ex partner: Not on file    Emotionally abused: Not on file    Physically abused: Not on file    Forced sexual activity: Not on file  Other Topics Concern  . Not on file  Social History Narrative   Lives with wife, Thayer Headings   Caffeine use: 2.5 cups coffee per day or more   Right-handed   Family History  Problem Relation Age of Onset  . Depression Mother   . Clotting disorder Father   . Colon cancer Neg Hx   . Rectal cancer Neg Hx   . Esophageal cancer Neg Hx   . Stomach cancer Neg Hx   . Liver cancer Neg Hx    I have reviewed his medical, social, and family history in detail and updated the electronic medical record as necessary.    PHYSICAL EXAMINATION  BP 124/74   Pulse 64   Ht 6' (1.829 m)   Wt 191 lb 4 oz (86.8 kg)   BMI 25.94 kg/m  Wt Readings from Last 3 Encounters:  07/24/18 191 lb 4 oz (86.8 kg)  01/16/17 197 lb 8 oz (89.6 kg)  06/04/16 189 lb (85.7 kg)  GEN: NAD, appears stated age, doesn't appear chronically ill PSYCH: Cooperative, without pressured speech EYE: Conjunctivae pink, sclerae anicteric ENT: MMM, without oral ulcers, no erythema or exudates noted NECK: Supple CV: RR without R/Gs RESP: CTAB posteriorly, without wheezing GI: NABS, soft, NT/ND, without rebound or guarding, no HSM appreciated MSK/EXT: No lower extremity edema SKIN: No jaundice NEURO:  Alert & Oriented x 3, no focal deficits, no evidence of asterixis   REVIEW OF DATA  I reviewed the following data at the time of  this encounter:  GI Procedures and Studies  April 2014 upper endoscopy 3 cm hiatal hernia noted. Moderate gastropathy/gastritis found in gastric antrum status post biopsies.  Fundic gland polyps present not biopsied.  Small amount of bile reflux noted. Duodenum without abnormalities in bulb and second portion of duodenum. Pathology consistent with minimal chronic inflammation of the antrum negative for H. pylori  September 2012 flexible sigmoidoscopy performed due to history of C. difficile colitis Colitis found from 0 to 30 cm which was patchy and erythematous but without pseudomembranes though stools smelt like C. difficile and stool was collected for culture/PCR as well as biopsies obtained Random colon biopsies showed no significant inflammation.  Tubular adenoma at 20 cm without high-grade dysplasia.  September 2010 colonoscopy Diverticulosis in the  sigmoid colon  Laboratory Studies  Reviewed in epic  Imaging Studies  No relevant studies to review   ASSESSMENT  Mr. Uemura is a 67 y.o. male with a pmh significant for headaches, arthritis, prior colonic tubular adenomas (last colonoscopy without any evidence so back on average risk screening), prior history of C. Difficile.  The patient is seen today for evaluation and management of:  1. Gastroesophageal reflux disease, esophagitis presence not specified    Patient is doing well overall. He had new onset GERD occur after his retirement and after the initiation of Nexium once daily he had improvement of his symptoms within just a couple days.  He is continued using the Nexium on a daily basis.  He has no other red flag symptoms other than his age.  He is previously had an upper endoscopic evaluation within the last 5-1/2 to 6 years.  The patient is going to begin to initiate trying to come off of the Nexium.  Trial for the next month to go to an every other day dosing and subsequently then come off.  Patient is aware that if he has  any recurrence of symptoms off of his Nexium that he should alert [place] or his primary care physician at that point we would have him restart his medication and then subsequently consider an upper endoscopic evaluation just to rule out that no other etiology such as Barrett's esophagus or malignancy or development of atypical eosinophilic esophagitis could be playing a role.  The patient will let us know if things change.  Colon cancer screening is due in 2020 and he is aware to recall for colonoscopy timing/scheduling.  All patient questions were answered, to the best of my ability, and the patient agrees to the aforementioned plan of action with follow-up as indicated.   PLAN  1. Gastroesophageal reflux disease, esophagitis presence not specified - Begin downtitration of Nexium --QOD for 40-month then off - Any recurrence of GERD he will need an EGD   No orders of the defined types were placed in this encounter.   New Prescriptions   No medications on file   Modified Medications   No medications on file    Planned Follow Up: No follow-ups on file.   Justice Britain, MD [facility] Office # [phone]

## 2018-07-27 DIAGNOSIS — K219 Gastro-esophageal reflux disease without esophagitis: Secondary | ICD-10-CM | POA: Insufficient documentation

## 2018-11-18 ENCOUNTER — Encounter: Payer: Self-pay | Admitting: Psychology

## 2018-11-27 ENCOUNTER — Encounter: Payer: Self-pay | Admitting: *Deleted

## 2018-11-27 DIAGNOSIS — H10503 Unspecified blepharoconjunctivitis, bilateral: Secondary | ICD-10-CM | POA: Diagnosis not present

## 2018-11-27 DIAGNOSIS — H00021 Hordeolum internum right upper eyelid: Secondary | ICD-10-CM | POA: Diagnosis not present

## 2018-11-30 ENCOUNTER — Ambulatory Visit (INDEPENDENT_AMBULATORY_CARE_PROVIDER_SITE_OTHER): Payer: Medicare Other | Admitting: Gastroenterology

## 2018-11-30 ENCOUNTER — Other Ambulatory Visit (INDEPENDENT_AMBULATORY_CARE_PROVIDER_SITE_OTHER): Payer: Medicare Other

## 2018-11-30 ENCOUNTER — Encounter: Payer: Self-pay | Admitting: Gastroenterology

## 2018-11-30 VITALS — BP 132/66 | HR 64 | Ht 72.0 in | Wt 192.0 lb

## 2018-11-30 DIAGNOSIS — K219 Gastro-esophageal reflux disease without esophagitis: Secondary | ICD-10-CM | POA: Diagnosis not present

## 2018-11-30 DIAGNOSIS — Z86010 Personal history of colonic polyps: Secondary | ICD-10-CM

## 2018-11-30 DIAGNOSIS — R1013 Epigastric pain: Secondary | ICD-10-CM

## 2018-11-30 DIAGNOSIS — Z1211 Encounter for screening for malignant neoplasm of colon: Secondary | ICD-10-CM | POA: Diagnosis not present

## 2018-11-30 LAB — CBC
HCT: 40.4 % (ref 39.0–52.0)
Hemoglobin: 13.7 g/dL (ref 13.0–17.0)
MCHC: 33.8 g/dL (ref 30.0–36.0)
MCV: 85.1 fl (ref 78.0–100.0)
Platelets: 321 10*3/uL (ref 150.0–400.0)
RBC: 4.75 Mil/uL (ref 4.22–5.81)
RDW: 13.1 % (ref 11.5–15.5)
WBC: 9.1 10*3/uL (ref 4.0–10.5)

## 2018-11-30 LAB — LIPASE: LIPASE: 47 U/L (ref 11.0–59.0)

## 2018-11-30 LAB — HEPATIC FUNCTION PANEL
ALT: 13 U/L (ref 0–53)
AST: 15 U/L (ref 0–37)
Albumin: 4.1 g/dL (ref 3.5–5.2)
Alkaline Phosphatase: 76 U/L (ref 39–117)
Bilirubin, Direct: 0.1 mg/dL (ref 0.0–0.3)
TOTAL PROTEIN: 6.9 g/dL (ref 6.0–8.3)
Total Bilirubin: 0.6 mg/dL (ref 0.2–1.2)

## 2018-11-30 LAB — C-REACTIVE PROTEIN: CRP: <1 mg/dL (ref 0.5–20.0)

## 2018-11-30 LAB — AMYLASE: Amylase: 61 U/L (ref 27–131)

## 2018-11-30 MED ORDER — PEG 3350-KCL-NA BICARB-NACL 420 G PO SOLR: 4000.0000 mL | Freq: Once | ORAL | 0 refills | Status: AC

## 2018-11-30 NOTE — Patient Instructions (Addendum)
If you are age 70 or older, your body mass index should be between 23-30. Your Body mass index is 26.04 kg/m. If this is out of the aforementioned range listed, please consider follow up with your Primary Care Provider.  If you are age 46 or younger, your body mass index should be between 19-25. Your Body mass index is 26.04 kg/m. If this is out of the aformentioned range listed, please consider follow up with your Primary Care Provider.    Your provider has requested that you go to the basement level for lab work before leaving today. Press "B" on the elevator. The lab is located at the first door on the left as you exit the elevator.  You have been scheduled for an abdominal ultrasound at [facility] (1st floor of hospital) on 12/07/2018 at 9:00am. Please arrive 15 minutes prior to your appointment for registration. Make certain not to have anything to eat or drink 6 hours prior to your appointment. Should you need to reschedule your appointment, please contact radiology at [phone]. This test typically takes about 30 minutes to perform.  You have been scheduled for an endoscopy and colonoscopy. Please follow the written instructions given to you at your visit today. Please pick up your prep supplies at the pharmacy within the next 1-3 days. If you use inhalers (even only as needed), please bring them with you on the day of your procedure. Your physician has requested that you go to www.startemmi.com and enter the access code given to you at your visit today. This web site gives a general overview about your procedure. However, you should still follow specific instructions given to you by our office regarding your preparation for the procedure.   Thank you for choosing me and [facility]. Dr. Rush Landmark

## 2018-11-30 NOTE — Progress Notes (Signed)
GASTROENTEROLOGY OUTPATIENT CLINIC VISIT   Primary Care Provider Maurice Small, MD [address] [place] [address] [phone]  Patient Profile: Donald Welch is a 68 y.o. male with a pmh significant for headaches, arthritis, prior colonic tubular adenomas, prior history of C. Difficile, prior Inguinal hernia s/p repair, GERD.  The patient presents to the [facility] for an evaluation and management of problem(s) noted below:  Problem List 1. Abdominal pain, epigastric   2. Gastroesophageal reflux disease, esophagitis presence not specified   3. Colon cancer screening     History of Present Illness: Please see initial consultation note for full details of HPI.    Interval History Today the patient comes in for previously unscheduled followup.  The patient tried to wean off Nexium and had been doing well.  Unfortunately he had recurrent symptoms.  He began to take Tums without much effectiveness.  He then restarted Nexium however did not feel that was working as well.  He is getting a midepigastric abdominal discomfort as well as burning sensation in his chest at times.  For the last month the patient has been experiencing this issue.  Interestingly he also has a discomfort in his chest that occurs if he has been significantly 8 working outside lifting that improves with some mild massage of the area.  The mid epigastric pain has no difference with food intake or fasting regards to aggravation or alleviation of pain.  The discomfort can last for hours at a time.  His weight is been stable.  He denies any dysphagia.  GI Review of Systems Positive as above including mild pyrosis Negative for nausea, vomiting, jaundice, early satiety, melena, hematochezia   Review of Systems General: Denies fevers/chills Cardiovascular: As per HPI in regards to chest pain  Pulmonary: Denies shortness of breath/cough Gastroenterological: See  HPI Genitourinary: Denies darkened urine Hematological: Denies easy bruising Dermatological: Denies jaundice Psychological: Mood is stable Musculoskeletal: Likely arthralgias from his retirement work   Medications Current Outpatient Medications  Medication Sig Dispense Refill  . acetaminophen (TYLENOL) 325 MG tablet Take 650 mg by mouth as needed. For headache    . diclofenac (VOLTAREN) 50 MG EC tablet Take 25 mg by mouth as needed. pain     . esomeprazole (NEXIUM) 40 MG capsule Take 1 capsule (40 mg total) by mouth 2 (two) times daily. 60 capsule 3   No current facility-administered medications for this visit.     Allergies Allergies  Allergen Reactions  . Penicillins     Other reaction(s): Other (See Comments) Childhood allergy  Childhood allergy     Histories Past Medical History:  Diagnosis Date  . Allergy   . Arthritis   . Cataracts, bilateral   . Diverticulosis   . Gastritis   . GERD (gastroesophageal reflux disease)   . Headache syndrome 01/16/2017  . Hiatal hernia    Past Surgical History:  Procedure Laterality Date  . APPENDECTOMY    . COLONOSCOPY    . FACIAL FRACTURE SURGERY  02/2011   has steel plates   . HERNIA REPAIR  2008 or 2009  . 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 UPPER GASTROINTESTINAL ENDOSCOPY     Social History   Socioeconomic History  . Marital status: Married    Spouse name: Thayer Headings  . Number of children: 2  . Years of education: 65  . Highest education level: Not on file  Occupational History  . Occupation: retired  Social Needs  . Financial resource strain: Not on file  . Food insecurity:    Worry: Not on file    Inability: Not on file  . Transportation needs:    Medical: Not on file    Non-medical: Not on file  Tobacco Use  . Smoking status: Former Research scientist (life sciences)  . Smokeless tobacco: Never Used  Substance and Sexual Activity  . Alcohol use: Yes    Comment: 12 beers per year  . Drug use: No  . Sexual activity: Not on file   Lifestyle  . Physical activity:    Days per week: Not on file    Minutes per session: Not on file  . Stress: Not on file  Relationships  . Social connections:    Talks on phone: Not on file    Gets together: Not on file    Attends religious service: Not on file    Active member of club or organization: Not on file    Attends meetings of clubs or organizations: Not on file    Relationship status: Not on file  . Intimate partner violence:    Fear of current or ex partner: Not on file    Emotionally abused: Not on file    Physically abused: Not on file    Forced sexual activity: Not on file  Other Topics Concern  . Not on file  Social History Narrative   Lives with wife, Thayer Headings   Caffeine use: 2.5 cups coffee per day or more   Right-handed   Family History  Problem Relation Age of Onset  . Depression Mother   . Clotting disorder Father   . Colon cancer Neg Hx   . Rectal cancer Neg Hx   . Esophageal cancer Neg Hx   . Stomach cancer Neg Hx   . Liver cancer Neg Hx   . Inflammatory bowel disease Neg Hx    I have reviewed his medical, social, and family history in detail and updated the electronic medical record as necessary.    PHYSICAL EXAMINATION  BP 132/66   Pulse 64   Ht 6' (1.829 m)   Wt 192 lb (87.1 kg)   BMI 26.04 kg/m  Wt Readings from Last 3 Encounters:  12/03/18 192 lb (87.1 kg)  11/30/18 192 lb (87.1 kg)  07/24/18 191 lb 4 oz (86.8 kg)  GEN: NAD, appears stated age, doesn't appear chronically ill PSYCH: Cooperative, without pressured speech EYE: Conjunctivae pink, sclerae anicteric ENT: MMM, without oral ulcers CV: RR without R/Gs RESP: CTAB posteriorly, without wheezing GI: NABS, soft, NT/ND, without rebound or guarding, no HSM appreciated MSK/EXT: No lower extremity edema SKIN: No jaundice NEURO:  Alert & Oriented x 3, no focal deficits, no evidence of asterixis   REVIEW OF DATA  I reviewed the following data at the time of this encounter:  GI  Procedures and Studies  No new relevant studies to review  Laboratory Studies  Reviewed in epic  Imaging Studies  No relevant studies to review   ASSESSMENT  Mr. Tebbetts is a 68 y.o. male with a pmh significant for headaches, arthritis, prior colonic tubular adenomas, prior history of C. Difficile, prior Inguinal hernia s/p repair, GERD.  The patient is seen today for evaluation and management of:  1. Abdominal pain, epigastric   2. Gastroesophageal reflux disease, esophagitis presence not specified   3. Colon cancer screening    The patient is hemodynamically stable however has developed a new epigastric abdominal pain.  The etiology  of this is not completely clear however we will proceed with an upper diagnostic endoscopy due to his inability to feel improvement on Nexium as he previously had to understand whether these are GERD related changes as well as rule out gastritis.  He has a history of prior colon polyps and due to the abdominal discomfort and region think it is reasonable to consider a diagnostic colonoscopy to ensure that there is nothing else that is going on from that particular standpoint as well as for surveillance of prior colon polyps.  His last colonoscopy was in 2014.  We will also obtain an abdominal ultra sound.  If this workup is unrevealing and he has persistent discomfort then will consider a CTAP with IV/PO contrast.  All patient questions were answered, to the best of my ability, and the patient agrees to the aforementioned plan of action with follow-up as indicated.   PLAN  Labs as outlined below Abdominal ultrasound to be ordered Diagnostic upper endoscopy and surveillance/diagnostic colonoscopy Work-up unremarkable then consider CT abdomen pelvis with IV/p.o. contrast   Orders Placed This Encounter  Procedures  . US Abdomen Complete  . CBC  . Amylase  . Hepatic function panel  . C-reactive protein  . Lipase  . Ambulatory referral to Gastroenterology     New Prescriptions   ESOMEPRAZOLE (NEXIUM) 40 MG CAPSULE    Take 1 capsule (40 mg total) by mouth 2 (two) times daily.   Modified Medications   No medications on file    Planned Follow Up: No follow-ups on file.   Justice Britain, MD [facility] Advanced Endoscopy Office # [phone]

## 2018-12-02 ENCOUNTER — Telehealth: Payer: Self-pay | Admitting: Gastroenterology

## 2018-12-02 NOTE — Telephone Encounter (Signed)
Pt is scheduled for endo/colon tomorrow and he is currently at [facility].  He doesn't know yet if he will be chosen until this afternoon around 3:00.  He doesn't want to cancel the procedure unless he is chosen for [facility] and he has been on clear liquids today.  Will call back this afternoon and let us know the status

## 2018-12-03 ENCOUNTER — Encounter: Payer: Self-pay | Admitting: Gastroenterology

## 2018-12-03 ENCOUNTER — Ambulatory Visit (AMBULATORY_SURGERY_CENTER): Payer: Medicare Other | Admitting: Gastroenterology

## 2018-12-03 VITALS — BP 110/64 | HR 44 | Temp 99.6°F | Resp 15 | Ht 72.0 in | Wt 192.0 lb

## 2018-12-03 DIAGNOSIS — K317 Polyp of stomach and duodenum: Secondary | ICD-10-CM | POA: Diagnosis not present

## 2018-12-03 DIAGNOSIS — K2970 Gastritis, unspecified, without bleeding: Secondary | ICD-10-CM

## 2018-12-03 DIAGNOSIS — R1013 Epigastric pain: Secondary | ICD-10-CM

## 2018-12-03 DIAGNOSIS — K219 Gastro-esophageal reflux disease without esophagitis: Secondary | ICD-10-CM

## 2018-12-03 DIAGNOSIS — K209 Esophagitis, unspecified without bleeding: Secondary | ICD-10-CM

## 2018-12-03 DIAGNOSIS — R1033 Periumbilical pain: Secondary | ICD-10-CM | POA: Diagnosis not present

## 2018-12-03 DIAGNOSIS — K2990 Gastroduodenitis, unspecified, without bleeding: Secondary | ICD-10-CM

## 2018-12-03 DIAGNOSIS — K2950 Unspecified chronic gastritis without bleeding: Secondary | ICD-10-CM | POA: Diagnosis not present

## 2018-12-03 DIAGNOSIS — K3189 Other diseases of stomach and duodenum: Secondary | ICD-10-CM | POA: Diagnosis not present

## 2018-12-03 DIAGNOSIS — Z1211 Encounter for screening for malignant neoplasm of colon: Secondary | ICD-10-CM | POA: Diagnosis not present

## 2018-12-03 DIAGNOSIS — K228 Other specified diseases of esophagus: Secondary | ICD-10-CM | POA: Diagnosis not present

## 2018-12-03 MED ORDER — SODIUM CHLORIDE 0.9 % IV SOLN: 500.0000 mL | Freq: Once | INTRAVENOUS | Status: DC

## 2018-12-03 MED ORDER — ESOMEPRAZOLE MAGNESIUM 40 MG PO CPDR: 40.0000 mg | DELAYED_RELEASE_CAPSULE | Freq: Two times a day (BID) | ORAL | 3 refills | Status: DC

## 2018-12-03 NOTE — Op Note (Signed)
[place] Patient Name: Donald Welch Procedure Date: 12/03/2018 9:12 AM MRN: [id number] Endoscopist: Justice Britain , MD Age: 68 Referring MD:  Date of Birth: December 13, 1950 Gender: Male Account #: [id number] Procedure:                Colonoscopy Indications:              High risk colon cancer surveillance: Personal                            history of colonic polyps, Incidental - Epigastric                            abdominal pain, Incidental - Periumbilical                            abdominal pain Medicines:                Monitored Anesthesia Care Procedure:                Pre-Anesthesia Assessment:                           - Prior to the procedure, a History and Physical                            was performed, and patient medications and                            allergies were reviewed. The patient's tolerance of                            previous anesthesia was also reviewed. The risks                            and benefits of the procedure and the sedation                            options and risks were discussed with the patient.                            All questions were answered, and informed consent                            was obtained. Prior Anticoagulants: The patient has                            taken no previous anticoagulant or antiplatelet                            agents. ASA Grade Assessment: II - A patient with                            mild systemic disease. After reviewing the risks  and benefits, the patient was deemed in                            satisfactory condition to undergo the procedure.                           After obtaining informed consent, the colonoscope                            was passed under direct vision. Throughout the                            procedure, the patient's blood pressure, pulse, and                            oxygen saturations were monitored continuously. The                             Colonoscope was introduced through the anus and                            advanced to the 5 cm into the ileum. The quality of                            the bowel preparation was excellent. The terminal                            ileum, ileocecal valve, appendiceal orifice, and                            rectum were photographed. The patient tolerated the                            procedure. Scope In: 9:31:48 AM Scope Out: 9:45:34 AM Scope Withdrawal Time: 0 hours 9 minutes 49 seconds  Total Procedure Duration: 0 hours 13 minutes 46 seconds  Findings:                 The digital rectal exam findings include                            non-thrombosed external hemorrhoids and                            non-thrombosed internal hemorrhoids. Pertinent                            negatives include no palpable rectal lesions.                           The terminal ileum and ileocecal valve appeared                            normal.  Normal mucosa was found in the entire colon.                           Multiple small-mouthed diverticula were found in                            the recto-sigmoid colon, sigmoid colon, descending                            colon, transverse colon and ascending colon.                           Non-bleeding non-thrombosed external and internal                            hemorrhoids were found during retroflexion, during                            perianal exam and during digital exam. The                            hemorrhoids were Grade II (internal hemorrhoids                            that prolapse but reduce spontaneously). Complications:            No immediate complications. Estimated Blood Loss:     Estimated blood loss: none. Impression:               - Non-thrombosed external hemorrhoids and                            non-thrombosed internal hemorrhoids found on                            digital rectal  exam.                           - The examined portion of the ileum was normal.                           - Normal mucosa in the entire examined colon.                           - Diverticulosis in the recto-sigmoid colon, in the                            sigmoid colon, in the descending colon, in the                            transverse colon and in the ascending colon.                           - Non-bleeding non-thrombosed external and internal  hemorrhoids. Recommendation:           - The patient will be observed post-procedure,                            until all discharge criteria are met.                           - Discharge patient to home.                           - Patient has a contact number available for                            emergencies. The signs and symptoms of potential                            delayed complications were discussed with the                            patient. Return to normal activities tomorrow.                            Written discharge instructions were provided to the                            patient.                           - High fiber diet.                           - Use fiber, for example Citrucel, Fibercon, Konsyl                            or Metamucil.                           - Continue present medications.                           - Repeat colonoscopy in 10 years for screening                            purposes as patient now has had 2 examinations                            without recurrent polyps and would be considered                            average-risk screening.                           - Dependent on how patient's symptoms are moving                            forward with the previously noted  EGD findings and                            recommendations, will consider the role of a CT-AP                            with IV/PO contrast in the future.                           - The  findings and recommendations were discussed                            with the patient.                           - The findings and recommendations were discussed                            with the patient's family. Justice Britain, MD 12/03/2018 9:56:49 AM

## 2018-12-03 NOTE — Progress Notes (Signed)
Called to room to assist during endoscopic procedure.  Patient ID and intended procedure confirmed with present staff. Received instructions for my participation in the procedure from the performing physician.  

## 2018-12-03 NOTE — Progress Notes (Signed)
PT taken to PACU. Monitors in place. VSS. Report given to RN. 

## 2018-12-03 NOTE — Patient Instructions (Signed)
Handouts Provided:  Esophagitis, Gastritis, Diverticulosis, and High Fiber Diet  YOU HAD AN ENDOSCOPIC PROCEDURE TODAY AT [facility]:   Refer to the procedure report that was given to you for any specific questions about what was found during the examination.  If the procedure report does not answer your questions, please call your gastroenterologist to clarify.  If you requested that your care partner not be given the details of your procedure findings, then the procedure report has been included in a sealed envelope for you to review at your convenience later.  YOU SHOULD EXPECT: Some feelings of bloating in the abdomen. Passage of more gas than usual.  Walking can help get rid of the air that was put into your GI tract during the procedure and reduce the bloating. If you had a lower endoscopy (such as a colonoscopy or flexible sigmoidoscopy) you may notice spotting of blood in your stool or on the toilet paper. If you underwent a bowel prep for your procedure, you may not have a normal bowel movement for a few days.  Please Note:  You might notice some irritation and congestion in your nose or some drainage.  This is from the oxygen used during your procedure.  There is no need for concern and it should clear up in a day or so.  SYMPTOMS TO REPORT IMMEDIATELY:   Following lower endoscopy (colonoscopy or flexible sigmoidoscopy):  Excessive amounts of blood in the stool  Significant tenderness or worsening of abdominal pains  Swelling of the abdomen that is new, acute  Fever of 100F or higher   Following upper endoscopy (EGD)  Vomiting of blood or coffee ground material  New chest pain or pain under the shoulder blades  Painful or persistently difficult swallowing  New shortness of breath  Fever of 100F or higher  Black, tarry-looking stools  For urgent or emergent issues, a gastroenterologist can be reached at any hour by calling [phone].   DIET:  We do  recommend a small meal at first, but then you may proceed to your regular diet.  Drink plenty of fluids but you should avoid alcoholic beverages for 24 hours.  ACTIVITY:  You should plan to take it easy for the rest of today and you should NOT DRIVE or use heavy machinery until tomorrow (because of the sedation medicines used during the test).    FOLLOW UP: Our staff will call the number listed on your records the next business day following your procedure to check on you and address any questions or concerns that you may have regarding the information given to you following your procedure. If we do not reach you, we will leave a message.  However, if you are feeling well and you are not experiencing any problems, there is no need to return our call.  We will assume that you have returned to your regular daily activities without incident.  If any biopsies were taken you will be contacted by phone or by letter within the next 1-3 weeks.  Please call us at [phone] if you have not heard about the biopsies in 3 weeks.    SIGNATURES/CONFIDENTIALITY: You and/or your care partner have signed paperwork which will be entered into your electronic medical record.  These signatures attest to the fact that that the information above on your After Visit Summary has been reviewed and is understood.  Full responsibility of the confidentiality of this discharge information lies with you and/or your care-partner.

## 2018-12-03 NOTE — Op Note (Signed)
[place] Patient Name: Donald Welch Procedure Date: 12/03/2018 9:12 AM MRN: [id number] Endoscopist: Justice Britain , MD Age: 68 Referring MD:  Date of Birth: 1951-10-21 Gender: Male Account #: [id number] Procedure:                Upper GI endoscopy Indications:              Epigastric abdominal pain, Periumbilical abdominal                            pain, Heartburn, Suspected gastro-esophageal reflux                            disease Medicines:                Monitored Anesthesia Care Procedure:                Pre-Anesthesia Assessment:                           - Prior to the procedure, a History and Physical                            was performed, and patient medications and                            allergies were reviewed. The patient's tolerance of                            previous anesthesia was also reviewed. The risks                            and benefits of the procedure and the sedation                            options and risks were discussed with the patient.                            All questions were answered, and informed consent                            was obtained. Prior Anticoagulants: The patient has                            taken no previous anticoagulant or antiplatelet                            agents. ASA Grade Assessment: II - A patient with                            mild systemic disease. After reviewing the risks                            and benefits, the patient was deemed in  satisfactory condition to undergo the procedure.                           After obtaining informed consent, the endoscope was                            passed under direct vision. Throughout the                            procedure, the patient's blood pressure, pulse, and                            oxygen saturations were monitored continuously. The                            Colonoscope was introduced through the  mouth, and                            advanced to the second part of duodenum. The upper                            GI endoscopy was accomplished without difficulty.                            The patient tolerated the procedure. Scope In: Scope Out: Findings:                 No gross lesions were noted in the proximal                            esophagus, in the mid esophagus and in the distal                            esophagus. Biopsies were taken with a cold forceps                            for histology from the proximal/middle esophagus                            for EoE. Biopsies were taken with a cold forceps                            for histology from the distal esophagus for EoE.                           LA Grade B (one or more mucosal breaks greater than                            5 mm, not extending between the tops of two mucosal                            folds) esophagitis with no bleeding was found at  the gastroesophageal junction.                           The Z-line was irregular and was found 40 cm from                            the incisors.                           Multiple 1 to 3 mm semi-sessile polyps were found                            in the gastric fundus and in the gastric body -                            typical appearance of fundic gland polyps. Biopsies                            were taken from a few of these polyps with a cold                            forceps for histology.                           No gross lesions were noted in the entire examined                            stomach otherwise. Biopsies were taken with a cold                            forceps for histology and Helicobacter pylori                            testing.                           No gross lesions were noted in the duodenal bulb,                            in the first portion of the duodenum and in the                            second  portion of the duodenum. Complications:            No immediate complications. Estimated Blood Loss:     Estimated blood loss was minimal. Impression:               - No gross lesions in esophagus. Biopsied for EoE.                            LA Grade B likelyreflux esophagitis at the [facility]. Z-line irregular, 40 cm from the incisors.                           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 Biopsied.                           - No other gross lesions in the stomach. Biopsied                            for HP.                           - No gross lesions in the duodenal bulb, in the                            first portion of the duodenum and in the second                            portion of the duodenum. Recommendation:           - Proceed to scheduled colonoscopy.                           - Restart PPI. Would recommend restarting Nexium 40                            mg but increase to twice daily. Will plan to                            maintain this for next 104-months.                           - Await pathology results.                           - Repeat upper endoscopy in 3 months to check                            healing.                           - The findings and recommendations were discussed                            with the patient.                           - The findings and recommendations were discussed                            with the patient's family. Justice Britain, MD 12/03/2018 9:52:36 AM

## 2018-12-04 ENCOUNTER — Telehealth: Payer: Self-pay | Admitting: *Deleted

## 2018-12-04 NOTE — Telephone Encounter (Signed)
Patient's esomeprazole 40 mg twice daily has been approved until 11/04/2019 from [facility].

## 2018-12-04 NOTE — Telephone Encounter (Signed)
  Follow up Call-  Call back number 12/03/2018  Post procedure Call Back phone  # [phone]  Permission to leave phone message Yes  Some recent data might be hidden   spoke with wife  Patient questions:  Do you have a fever, pain , or abdominal swelling? No. Pain Score  0 *  Have you tolerated food without any problems? Yes.    Have you been able to return to your normal activities? Yes.    Do you have any questions about your discharge instructions: Diet   No. Medications  No. Follow up visit  No.  Do you have questions or concerns about your Care? No.  Actions: * If pain score is 4 or above: No action needed, pain <4.

## 2018-12-07 ENCOUNTER — Ambulatory Visit (HOSPITAL_COMMUNITY)
Admission: RE | Admit: 2018-12-07 | Discharge: 2018-12-07 | Disposition: A | Discharge status: Home / Self Care | Payer: Medicare Other | Source: Ambulatory Visit | Attending: Gastroenterology | Admitting: Gastroenterology

## 2018-12-07 DIAGNOSIS — K219 Gastro-esophageal reflux disease without esophagitis: Secondary | ICD-10-CM | POA: Diagnosis not present

## 2018-12-07 DIAGNOSIS — K760 Fatty (change of) liver, not elsewhere classified: Secondary | ICD-10-CM | POA: Diagnosis not present

## 2018-12-07 DIAGNOSIS — R1013 Epigastric pain: Secondary | ICD-10-CM | POA: Diagnosis not present

## 2018-12-09 ENCOUNTER — Encounter: Payer: Self-pay | Admitting: Gastroenterology

## 2018-12-09 DIAGNOSIS — R1013 Epigastric pain: Secondary | ICD-10-CM | POA: Insufficient documentation

## 2019-01-25 ENCOUNTER — Ambulatory Visit: Payer: BLUE CROSS/BLUE SHIELD | Admitting: Psychology

## 2019-02-01 ENCOUNTER — Other Ambulatory Visit: Payer: Self-pay

## 2019-02-01 ENCOUNTER — Encounter: Payer: Medicare Other | Attending: Psychology | Admitting: Psychology

## 2019-02-01 DIAGNOSIS — R419 Unspecified symptoms and signs involving cognitive functions and awareness: Secondary | ICD-10-CM

## 2019-02-15 ENCOUNTER — Encounter: Payer: Medicare Other | Attending: Psychology | Admitting: Psychology

## 2019-02-15 ENCOUNTER — Other Ambulatory Visit: Payer: Self-pay

## 2019-02-15 ENCOUNTER — Ambulatory Visit: Payer: Medicare Other | Admitting: Psychology

## 2019-02-15 ENCOUNTER — Encounter: Payer: Self-pay | Admitting: Psychology

## 2019-02-15 DIAGNOSIS — G3184 Mild cognitive impairment, so stated: Secondary | ICD-10-CM | POA: Insufficient documentation

## 2019-02-15 NOTE — Progress Notes (Signed)
Mental Status Examination & Behavior Observations: The patient arrived on time to his 8:00 testing appointment. He was administered the Wechsler Adult Intelligence Scale, 4th Edition, and Wechsler Memory Scale, 4th Edition (Older Adult Battery). The evaluation lasted 240 minutes. He was alert and mostly oriented (e.g. incorrect date). He was appropriately dressed and appeared to have good hygiene. He ambulated independently without difficulty. His spontaneous speech was clear and prosodic with normal rate, tone, and volume. Mood was euthymic with appropriate and congruent affect. Thought processes were organized and logical with appropriate content. Insight and Judgement are fair.    He was cooperative with all assigned tasks and appeared to give good effort. He did not have difficulty understanding test instructions but often required them to be repeated. He exhibited good frustration tolerance on questions he did not know or tasks that were more difficult.   Results of the WAIS-IV are as follows:   Composite Score Summary  Scale Sum of Scaled Scores Composite Score Percentile Rank 95% Conf. Interval Qualitative Description  Verbal Comprehension 34 VCI 107 68 101-112 Average  Perceptual Reasoning 37 PRI 113 81 106-119 High Average  Working Memory 19 WMI 97 42 90-104 Average  Processing Speed 16 PSI 89 23 82-98 Low Average  Full Scale 106 FSIQ 104 61 100-108 Average  General Ability 71 GAI 111 77 106-116 High Average   Index Level Discrepancy Comparisons  Comparison Score 1 Score 2 Difference Critical Value .05 Significant Difference Y/N Base Rate by Overall Sample  VCI - PRI 107 113 -6 8.31 N 33.3  VCI - WMI 107 97 10 8.82 Y 22.8  VCI - PSI 107 89 18 10.19 Y 13.6  PRI - WMI 113 97 16 9.74 Y 10.6  PRI - PSI 113 89 24 11.00 Y 5.8  WMI - PSI 97 89 8 11.38 N 30.1  FSIQ - GAI 104 111 -7 3.08 Y 8.1   Differences Between Subtest and Overall Mean of Subtest Scores  Subtest Subtest Scaled  Score Mean Scaled Score Difference Critical Value .05 Strength or Weakness Base Rate  Block Design 15 10.60 4.40 2.85 S 2-5%  Similarities 14 10.60 3.40 2.82 S 5-10%  Digit Span 8 10.60 -2.60 2.22 W 15-25%  Symbol Search 7 10.60 -3.60 3.42 W 10-15%    Results of the WMS-IV (Older Adult battery) are as follows:   Brief Cognitive Status Exam Classification  Age Years of Education Raw Score Classification Level Base Rate  67 years 10 months 13 36 Low 3.5    Index Score Summary  Index Sum of Scaled Scores Index Score Percentile Rank 95% Confidence Interval Qualitative Descriptor  Auditory Memory (AMI) 22 74 4 69-82 Borderline  Visual Memory (VMI) 10 73 4 69-79 Borderline  Immediate Memory (IMI) 23 85 16 80-92 Low Average  Delayed Memory (DMI) 9 56 0.2 52-67 Extremely Low   Primary Subtest Scaled Score Summary  Subtest Domain Raw Score Scaled Score Percentile Rank  Logical Memory I AM 31 10 50  Logical Memory II AM 10 6 9   Verbal Paired Associates I AM 6 4 2   Verbal Paired Associates II AM 1 2 0.4  Visual Reproduction I VM 30 9 37  Visual Reproduction II VM 0 1 0.1  Symbol Span VWM 17 9 37   Process Score Raw Score Scaled Score Percentile Rank Cumulative Percentage (Base Rate)  LM II Recognition 16 - - 10-16%  VPA II Recognition 17 - - <=2%   Visual Memory Process Score Summary  Process Score Raw Score Scaled Score Percentile Rank Cumulative Percentage (Base Rate)  VR II Recognition 1 - - 3-9%

## 2019-02-18 ENCOUNTER — Ambulatory Visit: Payer: Medicare Other | Admitting: Psychology

## 2019-03-04 ENCOUNTER — Other Ambulatory Visit: Payer: Self-pay

## 2019-03-04 ENCOUNTER — Ambulatory Visit: Payer: Medicare Other | Admitting: Psychology

## 2019-03-04 ENCOUNTER — Encounter (HOSPITAL_BASED_OUTPATIENT_CLINIC_OR_DEPARTMENT_OTHER): Payer: Medicare Other | Admitting: Psychology

## 2019-03-04 DIAGNOSIS — G3184 Mild cognitive impairment, so stated: Secondary | ICD-10-CM

## 2019-03-04 DIAGNOSIS — R419 Unspecified symptoms and signs involving cognitive functions and awareness: Secondary | ICD-10-CM

## 2019-03-05 ENCOUNTER — Ambulatory Visit: Payer: Medicare Other | Admitting: Psychology

## 2019-03-09 ENCOUNTER — Other Ambulatory Visit: Payer: Self-pay

## 2019-03-09 ENCOUNTER — Encounter: Payer: Medicare Other | Attending: Psychology | Admitting: Psychology

## 2019-03-09 DIAGNOSIS — G44301 Post-traumatic headache, unspecified, intractable: Secondary | ICD-10-CM | POA: Insufficient documentation

## 2019-03-09 DIAGNOSIS — F0781 Postconcussional syndrome: Secondary | ICD-10-CM | POA: Insufficient documentation

## 2019-03-09 DIAGNOSIS — G3184 Mild cognitive impairment, so stated: Secondary | ICD-10-CM | POA: Diagnosis not present

## 2019-03-17 DIAGNOSIS — E291 Testicular hypofunction: Secondary | ICD-10-CM | POA: Diagnosis not present

## 2019-03-17 DIAGNOSIS — E785 Hyperlipidemia, unspecified: Secondary | ICD-10-CM | POA: Diagnosis not present

## 2019-03-17 DIAGNOSIS — Z5181 Encounter for therapeutic drug level monitoring: Secondary | ICD-10-CM | POA: Diagnosis not present

## 2019-03-17 DIAGNOSIS — Z1211 Encounter for screening for malignant neoplasm of colon: Secondary | ICD-10-CM | POA: Diagnosis not present

## 2019-03-17 DIAGNOSIS — Z125 Encounter for screening for malignant neoplasm of prostate: Secondary | ICD-10-CM | POA: Diagnosis not present

## 2019-03-19 DIAGNOSIS — Z0000 Encounter for general adult medical examination without abnormal findings: Secondary | ICD-10-CM | POA: Diagnosis not present
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
## 2019-03-24 ENCOUNTER — Encounter: Payer: Self-pay | Admitting: Psychology

## 2019-03-24 NOTE — Progress Notes (Signed)
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 Sequence 4 -- -- 92.5 --  Longest Letter-Number Sequence 4 -- -- 88.5 --    Process Level Discrepancy Comparisons  Process Comparison Score 1 Score 2 Difference Critical Value .05 Significant Difference Y/N Base Rate  Block Design - Block Design No Time Bonus 15 17 -2 3.08 N 3.9  Digit Span Forward - Digit Span Backward 9 10 -1 3.65 N 46.8  Digit Span Forward - Digit Span Sequencing 9 7 2  3.60 N 31.3  Digit Span Backward - Digit Span Sequencing 10 7 3  3.56 N 20.5  Longest Digit Span Forward - Longest Digit Span Backward 6 4 2  -- -- 62.0  Longest Digit Span Forward - Longest Digit Span Sequence 6 4 2  -- -- 35.5  Longest Digit Span Backward - Longest Digit Span Sequence 4 4 0 -- --   Statistical significance (  critical value) at the .05 level.   Raw Scores  Subtest Score Range Raw Score  Process Score Range Raw Score  Block Design 0-66 53  Block Design No Time Bonus 0-48 48  Similarities 0-36 31  Digit Span Forward 0-16 9   Digit Span 0-48 22  Digit Span Backward 0-16 8  Matrix Reasoning 0-26 16  Digit Span Sequencing 0-16 5  Vocabulary 0-57 42  Longest Digit Span Forward 0, 2-9 6  Arithmetic 0-22 15  Longest Digit Span Backward 0, 2-8 4  Symbol Search 0-60 18  Longest Digit Span Sequence 0, 2-9 4  Visual Puzzles 0-26 13  Longest Letter-Number Seq. 0, 2-8 4  Information 0-26 12      Coding 0-135 47      Letter-Number Seq. 0-30 15      Figure Weights 0-27 12      Comprehension 0-36 27      Cancellation 0-72 31      Picture Completion 0-24 10        End of Report  WMS-IV Wechsler Memory Scale-Fourth Edition Score Report   Examinee Name Debbie Bellucci  Date of Report 2019/03/24  Kingsley Spittle ID [id number]  Years of Education 57  Date of Birth [birth year]  Home Language   Gender Male  Handedness Not Specified  Race/Ethnicity   Examiner Name Ilean Skill  Date of Testing 2019/02/15  Age at Testing 6 years 10 months  Retest? No   Comments: Brief Cognitive Status Exam Classification  Age Years of Education Raw Score Classification Level Base Rate  67 years 10 months 13 36 Low 3.5    Index Score Summary  Index Sum of Scaled Scores Index Score Percentile Rank 95% Confidence Interval Qualitative Descriptor  Auditory Memory (AMI) 22 74 4 69-82 Borderline  Visual Memory (VMI) 10 73 4 69-79 Borderline  Immediate Memory (IMI) 23 85 16 80-92 Low Average  Delayed Memory (DMI) 9 56 0.2 52-67 Extremely Low    Index Score Profile The vertical bars represent the standard error of measurement (SEM).   Primary Subtest Scaled Score Summary  Subtest Domain Raw Score Scaled Score Percentile Rank  Logical Memory I AM 31 10 50  Logical Memory II AM 10 6 9   Verbal Paired Associates I AM 6 4 2   Verbal Paired Associates II AM 1 2 0.4  Visual Reproduction I VM 30 9 37  Visual Reproduction II VM 0 1 0.1  Symbol Span VWM 17 9 37   Primary Subtest Scaled Score Profile    PROCESS SCORE  CONVERSIONS  Auditory Memory Process Score Summary  Process Score Raw Score Scaled Score Percentile Rank Cumulative Percentage (Base Rate)  LM II Recognition 16 - - 10-16%  VPA II Recognition 17 - - <=2%   Visual Memory Process Score Summary  Process Score Raw Score Scaled Score Percentile Rank Cumulative Percentage (Base Rate)  VR II Recognition 1 - - 3-9%    SUBTEST-LEVEL DIFFERENCES WITHIN INDEXES  Auditory Memory Index  Subtest Scaled Score AMI Mean Score Difference from Mean Critical Value Base Rate  Logical Memory I 10 5.50 4.50 2.45 1-2%  Logical Memory II 6 5.50 0.50 2.38 >25%  Verbal Paired Associates I 4 5.50 -1.50 2.04 >25%  Verbal Paired Associates II 2 5.50 -3.50 3.06 2-5%  Statistical significance (critical value) at the .05 level.   Immediate Memory Index  Subtest Scaled Score IMI Mean Score Difference from Mean Critical Value Base Rate  Logical Memory I 10 7.67  2.33 2.01 15-25%  Verbal Paired Associates I 4 7.67 -3.67 1.71 5%  Visual Reproduction I 9 7.67 1.33 1.71 >25%  Statistical significance (critical value) at the .05 level.   Delayed Memory Index  Subtest Scaled Score DMI Mean Score Difference from Mean Critical Value Base Rate  Logical Memory II 6 3.00 3.00 2.15 10%  Verbal Paired Associates II 2 3.00 -1.00 2.63 >25%  Visual Reproduction II 1 3.00 -2.00 1.77 >25%  Statistical significance (critical value) at the .05 level.    SUBTEST DISCREPANCY COMPARISON  Comparison Score 1 Score 2 Difference Critical Value Base Rate  Visual Reproduction I - Visual Reproduction II 9 1 8  1.99 0.4  Statistical significance (critical value) at the .05 level.    SUBTEST-LEVEL CONTRAST SCALED SCORES  Logical Memory  Score Score 1 Score 2 Contrast Scaled Score  LM II Recognition vs. Delayed Recall 10-16% 6 9  LM Immediate Recall vs. Delayed Recall 10 6 3    Verbal Paired Associates  Score Score 1 Score 2 Contrast Scaled Score  VPA II Recognition vs. Delayed  Recall <=2% 2 6  VPA Immediate Recall vs. Delayed Recall 4 2 3    Visual Reproduction  Score Score 1 Score 2 Contrast Scaled Score  VR II Recognition vs. Delayed Recall 3-9% 1 3  VR Immediate Recall vs. Delayed Recall 9 1 1     INDEX-LEVEL CONTRAST SCALED SCORES  WMS-IV Indexes  Score Score 1 Score 2 Contrast Scaled Score  Auditory Memory Index vs. Visual Memory Index 74 73 7  Immediate Memory Index vs. Delayed Memory Index 85 56 1    RAW SCORES  Subtest Score Range Adult Score Range Older Adult Raw Score  Brief Cognitive Status Exam 0-58 0-58 36  Logical Memory I 0-50 0-53 31  Logical Memory II 0-50 0-39 10  Verbal Paired Associates I 0-56 0-40 6  Verbal Paired Associates II 0-14 0-10 1  CVLT-II Trials 1-5 5-95 5-95   CVLT-II Long-Delay -5-5 -5-5   Designs I 0-120    Designs II 0-120    Visual Reproduction I 0-43 0-43 30  Visual Reproduction II 0-43 0-43 0  Spatial Addition 0-24    Symbol Span 0-50 0-50 17  Process Score Range Adult Score Range Older Adult Raw Score  LM II Recognition 0-30 0-23 16  VPA II Recognition 0-40 0-30 17  VPA II Word Recall 0-28 0-20   DE I Content 0-48    DE I Spatial 0-24    DE II Content 0-48    DE II Spatial 0-24    DE II Recognition 0-24    VR II Recognition 0-7 0-7 1  VR II Copy 0-43 0-43     ABILITY-MEMORY ANALYSIS  Ability Score:  GAI: 111 Date of Testing:  WAIS-IV; WMS-IV 2019/02/15  Predicted Difference Method   Index Predicted WMS-IV Index Score Actual WMS-IV Index Score Difference Critical Value  Significant Difference Y/N Base Rate  Auditory Memory 106 74 32 9.22 Y <1%  Visual Memory 106 73 33 8.25 Y <1%  Immediate Memory 107 85 22 10.13 Y 3%  Delayed Memory 106 56 50 11.43 Y <1%  Statistical significance (critical value) at the .01 level.   Contrast Scaled Scores  Score Score 1 Score 2 Contrast Scaled Score  General Ability Index vs. Auditory Memory Index 111 74 3  General Ability Index vs. Visual Memory  Index 111 73 1  General Ability Index vs. Immediate Memory Index 111 85 4  General Ability Index  vs. Delayed Memory Index 111 56 1  Verbal Comprehension Index vs. Auditory Memory Index 107 74 3  Perceptual Reasoning Index vs. Visual Memory Index 113 73 1  Working Memory Index vs. Auditory Memory Index 97 74 5    End of Report

## 2019-03-24 NOTE — Progress Notes (Signed)
Today I completed the formal note for his appointment dated 02/01/2019.  The completed note can be found on that date and all of the work that was performed today has been documented on that previous note date.

## 2019-03-30 DIAGNOSIS — H524 Presbyopia: Secondary | ICD-10-CM | POA: Diagnosis not present

## 2019-03-30 DIAGNOSIS — H52203 Unspecified astigmatism, bilateral: Secondary | ICD-10-CM | POA: Diagnosis not present

## 2019-03-30 DIAGNOSIS — H2513 Age-related nuclear cataract, bilateral: Secondary | ICD-10-CM | POA: Diagnosis not present

## 2019-04-08 ENCOUNTER — Ambulatory Visit (AMBULATORY_SURGERY_CENTER): Payer: Self-pay

## 2019-04-08 ENCOUNTER — Other Ambulatory Visit: Payer: Self-pay

## 2019-04-08 VITALS — Ht 73.0 in | Wt 182.0 lb

## 2019-04-08 DIAGNOSIS — K2100 Gastro-esophageal reflux disease with esophagitis, without bleeding: Secondary | ICD-10-CM

## 2019-04-08 NOTE — Progress Notes (Signed)
Per pt, no allergies to soy or egg products.Pt not taking any weight loss meds or using  O2 at home.    Emmi info mailed to pt. Pt denies sedation problems.  The PV was done over the phone due to COVID-19. I verified pt's insurance and address. I reviewed the medical hx and prep instructions with the pt and will mail paperwork to the pt today.  Informed the pt to call with any questions or changes prior to his procedure. Pt understood.

## 2019-04-22 ENCOUNTER — Encounter: Payer: Medicare Other | Admitting: Gastroenterology

## 2019-05-12 ENCOUNTER — Telehealth: Payer: Self-pay | Admitting: Gastroenterology

## 2019-05-12 NOTE — Telephone Encounter (Signed)

## 2019-05-13 ENCOUNTER — Ambulatory Visit (AMBULATORY_SURGERY_CENTER): Payer: Medicare Other | Admitting: Gastroenterology

## 2019-05-13 ENCOUNTER — Other Ambulatory Visit: Payer: Self-pay

## 2019-05-13 ENCOUNTER — Encounter: Payer: Self-pay | Admitting: Gastroenterology

## 2019-05-13 VITALS — BP 118/72 | HR 44 | Temp 96.9°F | Resp 11 | Ht 73.0 in | Wt 182.0 lb

## 2019-05-13 DIAGNOSIS — K317 Polyp of stomach and duodenum: Secondary | ICD-10-CM | POA: Diagnosis not present

## 2019-05-13 DIAGNOSIS — K219 Gastro-esophageal reflux disease without esophagitis: Secondary | ICD-10-CM | POA: Diagnosis not present

## 2019-05-13 DIAGNOSIS — K209 Esophagitis, unspecified: Secondary | ICD-10-CM | POA: Diagnosis not present

## 2019-05-13 MED ORDER — SODIUM CHLORIDE 0.9 % IV SOLN: 500.0000 mL | Freq: Once | INTRAVENOUS | Status: DC

## 2019-05-13 NOTE — Patient Instructions (Signed)
Please read all of the information given to you by your recovery room nurse.  Please, note the change in your medication.  YOU HAD AN ENDOSCOPIC PROCEDURE TODAY AT [facility]:   Refer to the procedure report that was given to you for any specific questions about what was found during the examination.  If the procedure report does not answer your questions, please call your gastroenterologist to clarify.  If you requested that your care partner not be given the details of your procedure findings, then the procedure report has been included in a sealed envelope for you to review at your convenience later.  YOU SHOULD EXPECT: Some feelings of bloating in the abdomen. Passage of more gas than usual.    Please Note:  You might notice some irritation and congestion in your nose or some drainage.  This is from the oxygen used during your procedure.  There is no need for concern and it should clear up in a day or so.  SYMPTOMS TO REPORT IMMEDIATELY:    Following upper endoscopy (EGD)  Vomiting of blood or coffee ground material  New chest pain or pain under the shoulder blades  Painful or persistently difficult swallowing  New shortness of breath  Fever of 100F or higher  Black, tarry-looking stools  For urgent or emergent issues, a gastroenterologist can be reached at any hour by calling [phone].   DIET:  We do recommend a small meal at first, but then you may proceed to your regular diet.  Drink plenty of fluids but you should avoid alcoholic beverages for 24 hours.  ACTIVITY:  You should plan to take it easy for the rest of today and you should NOT DRIVE or use heavy machinery until tomorrow (because of the sedation medicines used during the test).    FOLLOW UP: Our staff will call the number listed on your records 48-72 hours following your procedure to check on you and address any questions or concerns that you may have regarding the information given to you  following your procedure. If we do not reach you, we will leave a message.  We will attempt to reach you two times.  During this call, we will ask if you have developed any symptoms of COVID 19. If you develop any symptoms (ie: fever, flu-like symptoms, shortness of breath, cough etc.) before then, please call [phone].  If you test positive for Covid 19 in the 2 weeks post procedure, please call and report this information to [place].    If any biopsies were taken you will be contacted by phone or by letter within the next 1-3 weeks.  Please call us at [phone] if you have not heard about the biopsies in 3 weeks.    SIGNATURES/CONFIDENTIALITY: You and/or your care partner have signed paperwork which will be entered into your electronic medical record.  These signatures attest to the fact that that the information above on your After Visit Summary has been reviewed and is understood.  Full responsibility of the confidentiality of this discharge information lies with you and/or your care-partner.

## 2019-05-13 NOTE — Progress Notes (Signed)
Called to room to assist during endoscopic procedure.  Patient ID and intended procedure confirmed with present staff. Received instructions for my participation in the procedure from the performing physician.  

## 2019-05-13 NOTE — Op Note (Signed)
[facility] Patient Name: Donald Welch Procedure Date: 05/13/2019 7:46 AM MRN: [id number] Endoscopist: Justice Britain , MD Age: 68 Referring MD:  Date of Birth: 09-Jun-1951 Gender: Male Account #: [id number] Procedure:                Upper GI endoscopy Indications:              Heartburn, Follow-up of esophagitis Medicines:                Monitored Anesthesia Care Procedure:                Pre-Anesthesia Assessment:                           - Prior to the procedure, a History and Physical                            was performed, and patient medications and                            allergies were reviewed. The patient's tolerance of                            previous anesthesia was also reviewed. The risks                            and benefits of the procedure and the sedation                            options and risks were discussed with the patient.                            All questions were answered, and informed consent                            was obtained. Prior Anticoagulants: The patient has                            taken no previous anticoagulant or antiplatelet                            agents except for NSAID medication. ASA Grade                            Assessment: II - A patient with mild systemic                            disease. After reviewing the risks and benefits,                            the patient was deemed in satisfactory condition to                            undergo the procedure.  After obtaining informed consent, the endoscope was                            passed under direct vision. Throughout the                            procedure, the patient's blood pressure, pulse, and                            oxygen saturations were monitored continuously. The                            Endoscope was introduced through the mouth, and                            advanced to the second part of  duodenum. The upper                            GI endoscopy was accomplished without difficulty.                            The patient tolerated the procedure. Scope In: Scope Out: Findings:                 No gross lesions were noted in the proximal                            esophagus and in the mid esophagus and majority of                            distal esophagus. Previous esophagitis has healed.                           A scattered island of salmon-colored mucosa was                            present at 39 cm. No other visible abnormalities                            were present. Biopsies were taken with a cold                            forceps for histology.                           The Z-line was regular and was found 40 cm from the                            incisors.                           Multiple small semi-sessile polyps were found in                            the  gastric fundus and in the gastric body -                            consistent with fundic gland polyps.                           No other gross lesions were noted in the entire                            examined stomach.                           No gross lesions were noted in the duodenal bulb,                            in the first portion of the duodenum and in the                            second portion of the duodenum. Complications:            No immediate complications. Estimated Blood Loss:     Estimated blood loss was minimal. Impression:               - No gross lesions in majority of esophagus.                           - Salmon-colored mucosa suspicious for Barrett's                            esophagus. Biopsied.                           - Z-line regular, 40 cm from the incisors.                           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                           - No other gross lesions in the stomach.                           - No gross lesions in the duodenal  bulb, in the                            first portion of the duodenum and in the second                            portion of the duodenum. Recommendation:           - The patient will be observed post-procedure,                            until all discharge criteria are met.                           -  Discharge patient to home.                           - Patient has a contact number available for                            emergencies. The signs and symptoms of potential                            delayed complications were discussed with the                            patient. Return to normal activities tomorrow.                            Written discharge instructions were provided to the                            patient.                           - Resume previous diet.                           - Increase Omeprazole to 20 BID or 40 mg daily.                           - Observe patient's clinical course.                           - Await pathology results. If evidence of Barrett's                            esophagus will need surveillance.                           - Follow up in clinic in 6-8 weeks.                           - Can discuss in future based on patient's symptoms                            need for pH Impedence testing +/- consideration of                            continued medical management vs surgical vs                            endoscopic fundoplication.                           - The findings and recommendations were discussed                            with the patient. Justice Britain, MD 05/13/2019 8:12:14 AM

## 2019-05-13 NOTE — Progress Notes (Signed)
NO CHANGE IN MED HX SINCE OFFICE/PRE VISIT TEMP & COVID SCREEN BY N CAMPBELL  OTHER VS AND ANDM BY Lorrin Goodell

## 2019-05-13 NOTE — Progress Notes (Signed)
Pt Drowsy. VSS. To PACU, report to RN. No anesthetic complications noted.  

## 2019-05-13 NOTE — Addendum Note (Signed)
Addended by: Ernestine Conrad D on: 05/13/2019 02:07 PM   Modules accepted: Orders

## 2019-05-17 ENCOUNTER — Telehealth: Payer: Self-pay

## 2019-05-17 NOTE — Telephone Encounter (Signed)
  Follow up Call-  Call back number 05/13/2019 12/03/2018  Post procedure Call Back phone  # [phone]  Permission to leave phone message Yes Yes  Some recent data might be hidden     Patient questions:  Do you have a fever, pain , or abdominal swelling? No. Pain Score  0 *  Have you tolerated food without any problems? Yes.    Have you been able to return to your normal activities? Yes.    Do you have any questions about your discharge instructions: Diet   No. Medications  No. Follow up visit  No.  Do you have questions or concerns about your Care? No.  Actions: * If pain score is 4 or above: No action needed, pain <4.  1. Have you developed a fever since your procedure? no  2.   Have you had an respiratory symptoms (SOB or cough) since your procedure? no  3.   Have you tested positive for COVID 19 since your procedure no  4.   Have you had any family members/close contacts diagnosed with the COVID 19 since your procedure?  no   If yes to any of these questions please route to Joylene John, RN and Alphonsa Gin, Therapist, sports.

## 2019-05-18 ENCOUNTER — Encounter: Payer: Self-pay | Admitting: Gastroenterology

## 2019-06-21 DIAGNOSIS — L820 Inflamed seborrheic keratosis: Secondary | ICD-10-CM | POA: Diagnosis not present

## 2019-06-21 DIAGNOSIS — L821 Other seborrheic keratosis: Secondary | ICD-10-CM | POA: Diagnosis not present

## 2019-06-21 DIAGNOSIS — L578 Other skin changes due to chronic exposure to nonionizing radiation: Secondary | ICD-10-CM | POA: Diagnosis not present

## 2019-11-24 ENCOUNTER — Ambulatory Visit: Payer: Medicare Other | Attending: Internal Medicine

## 2019-11-24 DIAGNOSIS — Z23 Encounter for immunization: Secondary | ICD-10-CM | POA: Insufficient documentation

## 2019-12-10 ENCOUNTER — Encounter: Payer: Medicare Other | Attending: Psychology | Admitting: Psychology

## 2019-12-10 ENCOUNTER — Other Ambulatory Visit: Payer: Self-pay

## 2019-12-10 DIAGNOSIS — F0781 Postconcussional syndrome: Secondary | ICD-10-CM

## 2019-12-16 ENCOUNTER — Ambulatory Visit: Payer: Medicare Other | Attending: Internal Medicine

## 2019-12-16 DIAGNOSIS — Z23 Encounter for immunization: Secondary | ICD-10-CM | POA: Insufficient documentation

## 2019-12-16 NOTE — Progress Notes (Signed)
   Covid-19 Vaccination Clinic  Name:  Donald Welch    MRN: [id number] DOB: [birth year]  12/16/2019  Mr. Risco was observed post Covid-19 immunization for 15 minutes without incidence. He was provided with Vaccine Information Sheet and instruction to access the V-Safe system.   Mr. Sedgwick was instructed to call 911 with any severe reactions post vaccine: Marland Kitchen Difficulty breathing  . Swelling of your face and throat  . A fast heartbeat  . A bad rash all over your body  . Dizziness and weakness    Immunizations Administered    Name Date Dose VIS Date Route   Pfizer COVID-19 Vaccine 12/16/2019  8:41 AM 0.3 mL 10/15/2019 Intramuscular   Manufacturer: [place]   Lot: [id number]   [place]: [id number]

## 2019-12-31 NOTE — Progress Notes (Signed)
Today, the patient came back for a follow-up appointment with regard to his postconcussion syndrome.  The patient's full evaluation previously can be found in his medical records dated 03/09/2019.  The patient reports that he has improved some since our last visit and is having less frequent headaches and his attention and concentration and memory have stayed the same or improved.  At this point, I do not think we need to do further testing as he is not showing any indication of progressive decline with his cognitive functioning.

## 2020-01-11 DIAGNOSIS — K469 Unspecified abdominal hernia without obstruction or gangrene: Secondary | ICD-10-CM | POA: Diagnosis not present

## 2020-03-06 DIAGNOSIS — M25552 Pain in left hip: Secondary | ICD-10-CM | POA: Diagnosis not present

## 2020-03-06 DIAGNOSIS — M7061 Trochanteric bursitis, right hip: Secondary | ICD-10-CM | POA: Diagnosis not present

## 2020-03-06 DIAGNOSIS — M25551 Pain in right hip: Secondary | ICD-10-CM | POA: Diagnosis not present

## 2020-03-06 DIAGNOSIS — M7062 Trochanteric bursitis, left hip: Secondary | ICD-10-CM | POA: Diagnosis not present

## 2020-03-20 ENCOUNTER — Ambulatory Visit: Payer: Self-pay | Admitting: Surgery

## 2020-03-20 DIAGNOSIS — K4091 Unilateral inguinal hernia, without obstruction or gangrene, recurrent: Secondary | ICD-10-CM | POA: Diagnosis not present

## 2020-03-20 NOTE — H&P (Signed)
History of Present Illness Donald Welch. Donald Thurmon MD; 03/20/2020 12:17 PM) The patient is a 69 year old male who presents with an inguinal hernia. Referred by Donald Welch for recurrent left inguinal hernia This is a 70 year old male who presents after I repaired a left inguinal hernia about 15 years ago. I do not have my operative note available. I did use mesh. The patient was doing well. He retired recently and has been much more active. Several months ago he was doing some yard work when he felt a tearing sensation in his left groin. Subsequently he developed a bulge. The bulge remains reducible. This area is fairly uncomfortable. He denies any change in his bowel habits. He was seen by his PCP who referred him for surgical evaluation.   Problem List/Past Medical Donald Key K. 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  Diagnostic Studies History (Donald Welch, [place]; 03/20/2020 11:00 AM) Colonoscopy  within last year  Allergies (Donald Welch, [place]; 03/20/2020 11:01 AM) Penicillins  Allergies Reconciled   Medication History (Donald Welch, RMA; 03/20/2020 11:01 AM) Diclofenac Sodium ER (100MG  Tablet ER 24HR, Oral) Active. Sildenafil Citrate (100MG  Tablet, Oral) Active. Medications Reconciled  Social History (Donald Welch, [place]; 03/20/2020 11:00 AM) Alcohol use  Occasional alcohol use. Caffeine use  Coffee. No drug use  Tobacco use  Former smoker.  Family History (Donald Welch, RMA; 03/20/2020 11:00 AM) Breast Cancer  Mother. Heart disease in male family member before age 71   Other Problems Donald Welch. Donald Devera, MD; 03/20/2020 12:17 PM) Gastroesophageal Reflux Disease  Inguinal Hernia   Vitals (Donald Welch RMA; 03/20/2020 11:01 AM) 03/20/2020 11:01  AM Weight: 201 lb Height: 71in Body Surface Area: 2.11 m Body Mass Index: 28.03 kg/m  Temp.: 97.76F  Pulse: 69 (Regular)  BP: 124/72(Sitting, Left Arm, Standard)       Physical Exam Donald Key K. Donald Lasky MD; 03/20/2020 12:18 PM) The physical exam findings are as follows: Note: Constitutional: WDWN in NAD, conversant, no obvious deformities; resting comfortably Eyes: Pupils equal, round; sclera anicteric; moist conjunctiva; no lid lag HENT: Oral mucosa moist; good dentition Neck: No masses palpated, trachea midline; no thyromegaly Lungs: CTA bilaterally; normal respiratory effort CV: Regular rate and rhythm; no murmurs; extremities well-perfused with no edema Abd: +bowel sounds, soft, non-tender, no palpable organomegaly; no palpable hernias GU: Bilateral descended testes, no testicular masses, no sign of right inguinal hernia, [name]. This only appears with Valsalva maneuver and this easily reducible. Musc: Normal gait; no apparent clubbing or cyanosis in extremities Lymphatic: No palpable cervical or axillary lymphadenopathy Skin: Warm, dry; no sign of jaundice Psychiatric - alert and oriented x 4; calm mood and affect    Assessment & Plan Donald Key K. 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 The surgical procedure has been discussed with the patient. Potential risks, benefits, alternative treatments, and expected outcomes have been explained. All of the patient's questions at this time have been answered. The likelihood of reaching the patient's treatment goal is good. The patient understand the proposed surgical procedure and wishes to proceed.    Donald Welch. Georgette Dover, MD, [facility]  General/ Trauma Surgery  03/20/2020 12:19 PM

## 2020-03-22 DIAGNOSIS — Z0000 Encounter for general adult medical examination without abnormal findings: Secondary | ICD-10-CM | POA: Diagnosis not present

## 2020-03-22 DIAGNOSIS — R413 Other amnesia: Secondary | ICD-10-CM | POA: Diagnosis not present

## 2020-03-22 DIAGNOSIS — R001 Bradycardia, unspecified: Secondary | ICD-10-CM | POA: Diagnosis not present

## 2020-03-22 DIAGNOSIS — Z5181 Encounter for therapeutic drug level monitoring: Secondary | ICD-10-CM | POA: Diagnosis not present

## 2020-03-22 DIAGNOSIS — G43909 Migraine, unspecified, not intractable, without status migrainosus: Secondary | ICD-10-CM | POA: Diagnosis not present

## 2020-03-22 DIAGNOSIS — E785 Hyperlipidemia, unspecified: Secondary | ICD-10-CM | POA: Diagnosis not present

## 2020-05-12 DIAGNOSIS — Z01818 Encounter for other preprocedural examination: Secondary | ICD-10-CM | POA: Diagnosis not present

## 2020-05-18 DIAGNOSIS — G8918 Other acute postprocedural pain: Secondary | ICD-10-CM | POA: Diagnosis not present

## 2020-05-18 DIAGNOSIS — K4091 Unilateral inguinal hernia, without obstruction or gangrene, recurrent: Secondary | ICD-10-CM | POA: Diagnosis not present

## 2020-05-24 IMAGING — US US ABDOMEN COMPLETE
1 series · 14 of 25 positions shown · non-contrast
Comparison: None.

CLINICAL DATA: Epigastric pain for 2 months

EXAM:
ABDOMEN ULTRASOUND COMPLETE

[Series 1: us abdomen complete · 0.17mm/px · 14 of 87 slices shown]
[im 1/87]
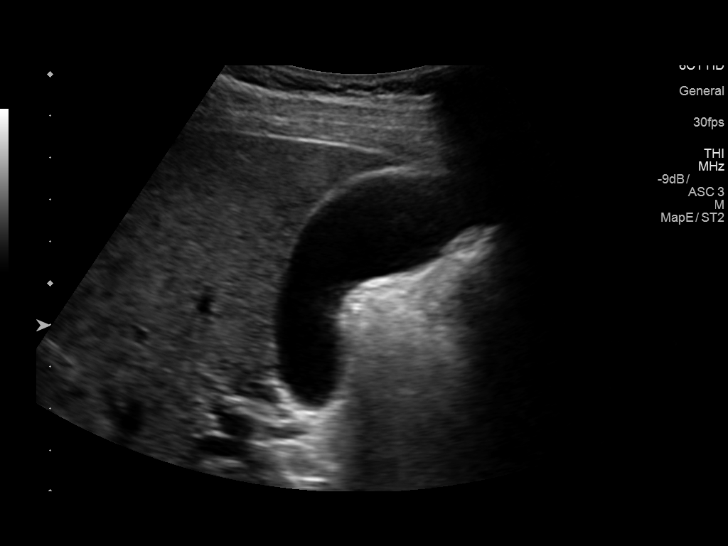
[im 8/87]
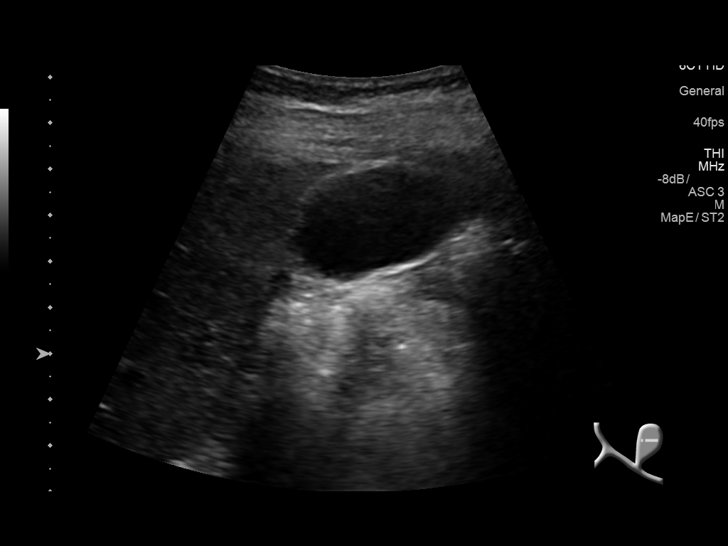
[im 15/87]
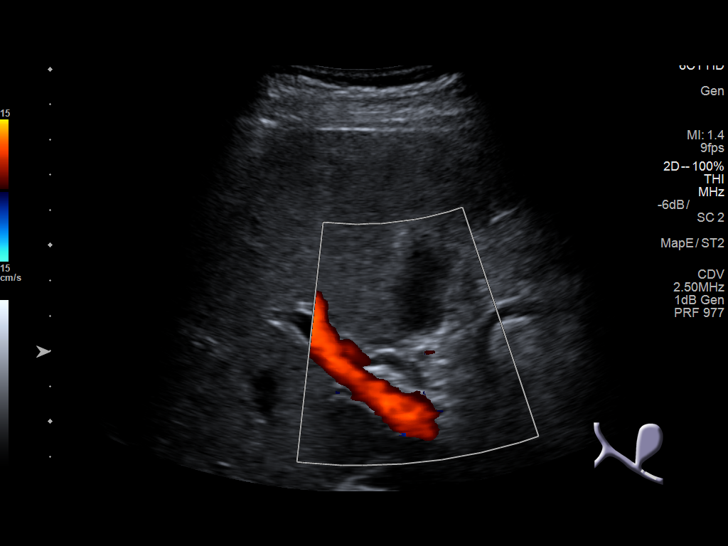
[im 22/87]
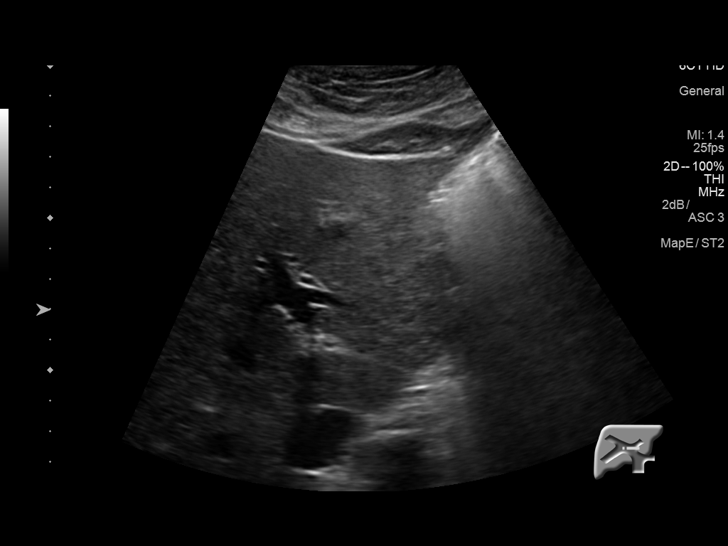
[im 29/87]
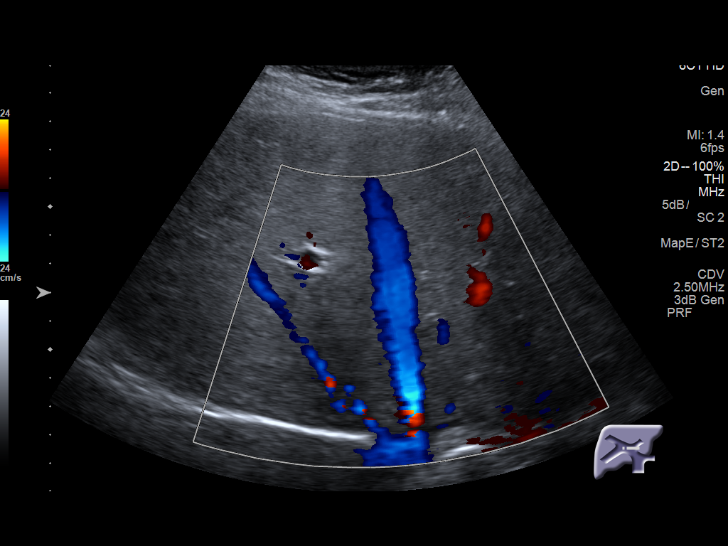
[im 33/87]
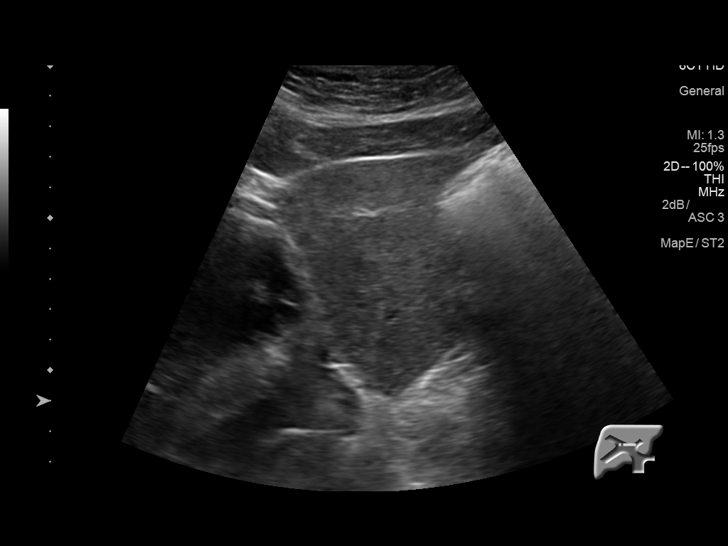
[im 40/87]
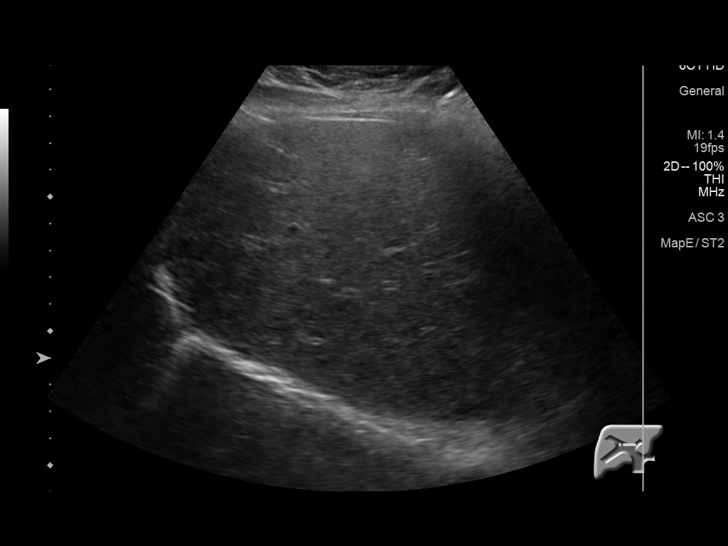
[im 47/87]
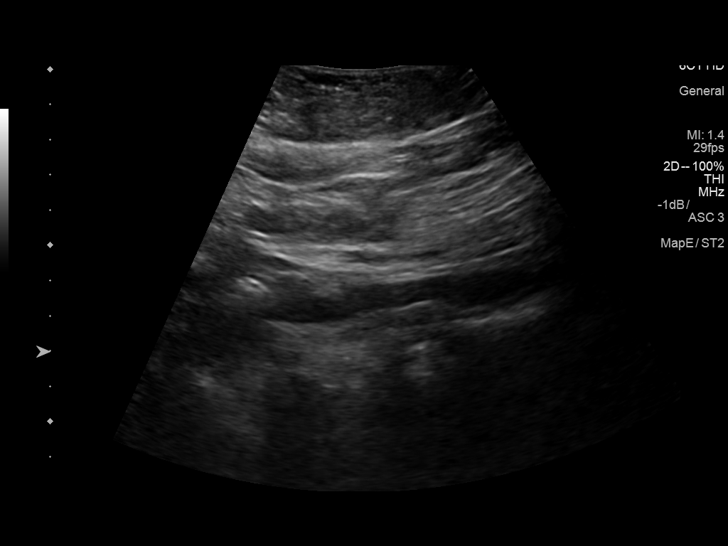
[im 54/87]
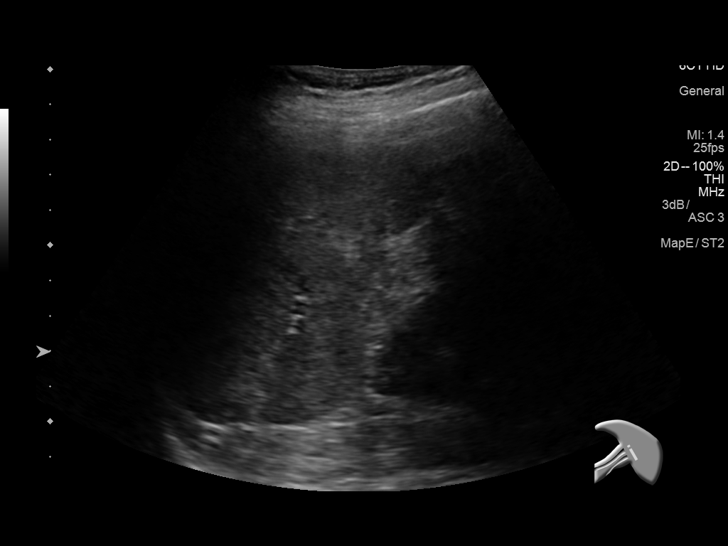
[im 58/87]
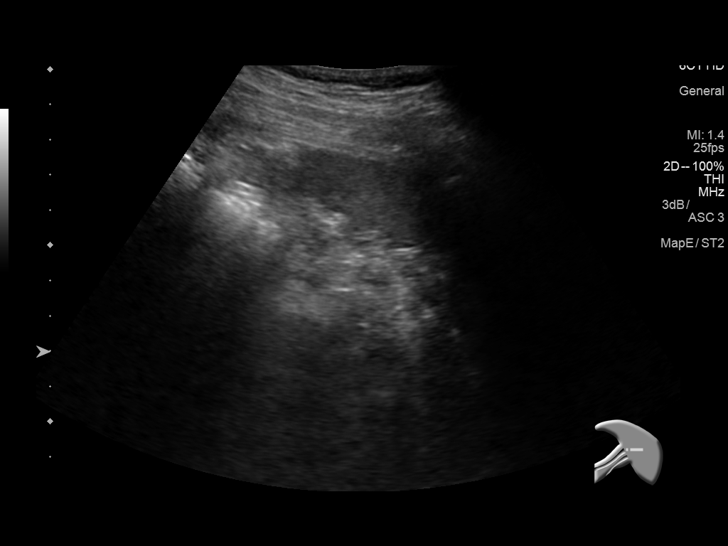
[im 65/87]
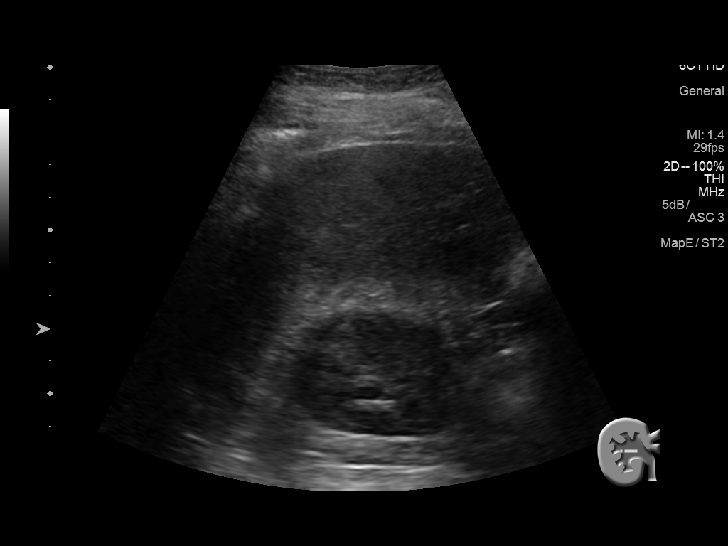
[im 72/87]
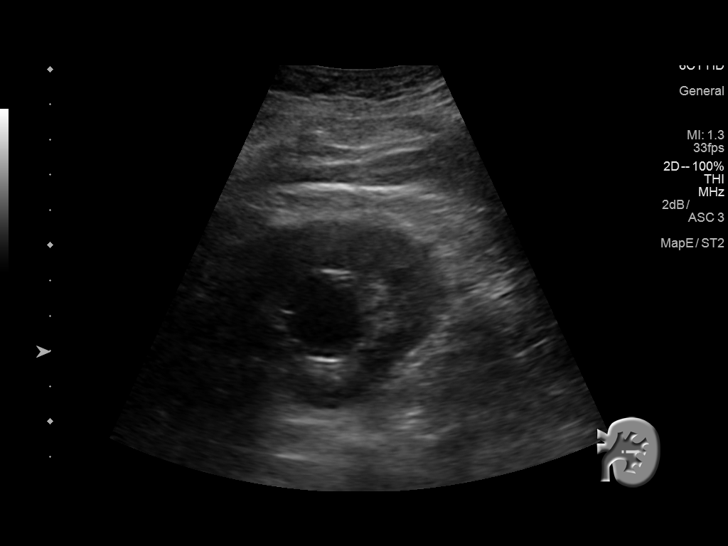
[im 79/87]
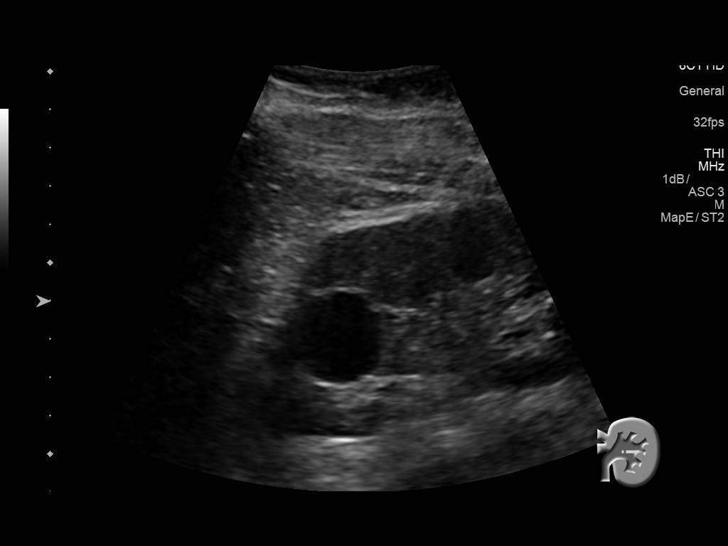
[im 87/87]
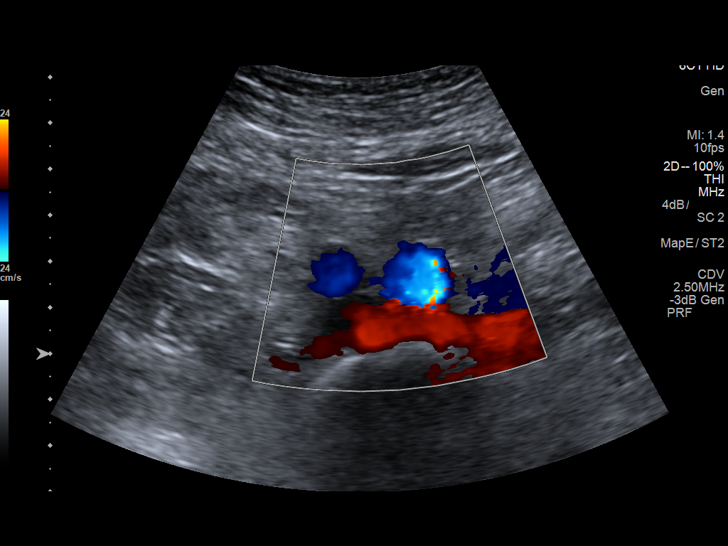

[14 of 25 positions shown; findings below may reference images not displayed]

FINDINGS: Gallbladder: No gallstones or wall thickening visualized. No
sonographic Murphy sign noted by sonographer.

Common bile duct: Diameter: 4 mm

Liver: Mild increased echogenicity is noted likely related to fatty
infiltration. Portal vein is patent on color Doppler imaging with
normal direction of blood flow towards the liver.

IVC: No abnormality visualized.

Pancreas: Not well visualized due to overlying bowel gas.

Spleen: Size and appearance within normal limits.

Right Kidney: Length: 12.1 cm.. Echogenicity within normal limits.
No mass or hydronephrosis visualized.

Left Kidney: Length: 12.3 cm.. 2.6 cm upper pole cyst is noted
centrally. No mass lesion or hydronephrosis is noted.

Abdominal aorta: No aneurysm visualized.

Other findings: Left renal cyst.
IMPRESSION: Fatty liver.

No other focal abnormality is noted.

## 2020-06-21 DIAGNOSIS — D225 Melanocytic nevi of trunk: Secondary | ICD-10-CM | POA: Diagnosis not present

## 2020-06-21 DIAGNOSIS — L821 Other seborrheic keratosis: Secondary | ICD-10-CM | POA: Diagnosis not present

## 2020-06-21 DIAGNOSIS — L578 Other skin changes due to chronic exposure to nonionizing radiation: Secondary | ICD-10-CM | POA: Diagnosis not present

## 2020-06-21 DIAGNOSIS — D1801 Hemangioma of skin and subcutaneous tissue: Secondary | ICD-10-CM | POA: Diagnosis not present

## 2020-10-09 DIAGNOSIS — Z23 Encounter for immunization: Secondary | ICD-10-CM | POA: Diagnosis not present

## 2021-05-15 DIAGNOSIS — E785 Hyperlipidemia, unspecified: Secondary | ICD-10-CM | POA: Diagnosis not present

## 2021-05-15 DIAGNOSIS — R413 Other amnesia: Secondary | ICD-10-CM | POA: Diagnosis not present

## 2021-05-15 DIAGNOSIS — E291 Testicular hypofunction: Secondary | ICD-10-CM | POA: Diagnosis not present

## 2021-05-15 DIAGNOSIS — R001 Bradycardia, unspecified: Secondary | ICD-10-CM | POA: Diagnosis not present

## 2021-05-15 DIAGNOSIS — Z5181 Encounter for therapeutic drug level monitoring: Secondary | ICD-10-CM | POA: Diagnosis not present

## 2021-05-15 DIAGNOSIS — Z0000 Encounter for general adult medical examination without abnormal findings: Secondary | ICD-10-CM | POA: Diagnosis not present

## 2021-05-15 DIAGNOSIS — G43909 Migraine, unspecified, not intractable, without status migrainosus: Secondary | ICD-10-CM | POA: Diagnosis not present

## 2022-06-20 DIAGNOSIS — L853 Xerosis cutis: Secondary | ICD-10-CM | POA: Diagnosis not present

## 2022-06-20 DIAGNOSIS — H9193 Unspecified hearing loss, bilateral: Secondary | ICD-10-CM | POA: Diagnosis not present

## 2022-06-20 DIAGNOSIS — R001 Bradycardia, unspecified: Secondary | ICD-10-CM | POA: Diagnosis not present

## 2022-06-20 DIAGNOSIS — Z125 Encounter for screening for malignant neoplasm of prostate: Secondary | ICD-10-CM | POA: Diagnosis not present

## 2022-06-20 DIAGNOSIS — Z0000 Encounter for general adult medical examination without abnormal findings: Secondary | ICD-10-CM | POA: Diagnosis not present

## 2022-06-20 DIAGNOSIS — Z79899 Other long term (current) drug therapy: Secondary | ICD-10-CM | POA: Diagnosis not present

## 2022-06-20 DIAGNOSIS — E785 Hyperlipidemia, unspecified: Secondary | ICD-10-CM | POA: Diagnosis not present

## 2022-10-08 DIAGNOSIS — R413 Other amnesia: Secondary | ICD-10-CM | POA: Diagnosis not present

## 2022-10-09 DIAGNOSIS — R413 Other amnesia: Secondary | ICD-10-CM | POA: Diagnosis not present

## 2022-10-09 DIAGNOSIS — E785 Hyperlipidemia, unspecified: Secondary | ICD-10-CM | POA: Diagnosis not present

## 2022-10-23 DIAGNOSIS — L821 Other seborrheic keratosis: Secondary | ICD-10-CM | POA: Diagnosis not present

## 2022-10-23 DIAGNOSIS — L570 Actinic keratosis: Secondary | ICD-10-CM | POA: Diagnosis not present

## 2022-10-23 DIAGNOSIS — L578 Other skin changes due to chronic exposure to nonionizing radiation: Secondary | ICD-10-CM | POA: Diagnosis not present

## 2022-10-23 DIAGNOSIS — L820 Inflamed seborrheic keratosis: Secondary | ICD-10-CM | POA: Diagnosis not present

## 2022-12-08 DIAGNOSIS — H60501 Unspecified acute noninfective otitis externa, right ear: Secondary | ICD-10-CM | POA: Diagnosis not present

## 2022-12-08 DIAGNOSIS — H6123 Impacted cerumen, bilateral: Secondary | ICD-10-CM | POA: Diagnosis not present

## 2022-12-08 DIAGNOSIS — H6503 Acute serous otitis media, bilateral: Secondary | ICD-10-CM | POA: Diagnosis not present

## 2022-12-31 DIAGNOSIS — E785 Hyperlipidemia, unspecified: Secondary | ICD-10-CM | POA: Diagnosis not present

## 2023-03-27 DIAGNOSIS — R079 Chest pain, unspecified: Secondary | ICD-10-CM | POA: Diagnosis not present

## 2023-04-01 DIAGNOSIS — L988 Other specified disorders of the skin and subcutaneous tissue: Secondary | ICD-10-CM | POA: Diagnosis not present

## 2023-04-01 DIAGNOSIS — R0781 Pleurodynia: Secondary | ICD-10-CM | POA: Diagnosis not present

## 2023-04-01 DIAGNOSIS — R079 Chest pain, unspecified: Secondary | ICD-10-CM | POA: Diagnosis not present

## 2023-04-01 DIAGNOSIS — F439 Reaction to severe stress, unspecified: Secondary | ICD-10-CM | POA: Diagnosis not present

## 2023-06-17 ENCOUNTER — Encounter: Payer: Self-pay | Admitting: Interventional Cardiology

## 2023-06-17 ENCOUNTER — Ambulatory Visit: Payer: Medicare Other | Admitting: Interventional Cardiology

## 2023-06-17 VITALS — BP 124/80 | HR 58 | Ht 73.0 in | Wt 198.6 lb

## 2023-06-17 DIAGNOSIS — R072 Precordial pain: Secondary | ICD-10-CM | POA: Diagnosis present

## 2023-06-17 DIAGNOSIS — R079 Chest pain, unspecified: Secondary | ICD-10-CM | POA: Diagnosis present

## 2023-06-17 DIAGNOSIS — Z87891 Personal history of nicotine dependence: Secondary | ICD-10-CM | POA: Diagnosis not present

## 2023-06-17 DIAGNOSIS — R001 Bradycardia, unspecified: Secondary | ICD-10-CM | POA: Diagnosis not present

## 2023-06-17 DIAGNOSIS — E782 Mixed hyperlipidemia: Secondary | ICD-10-CM | POA: Insufficient documentation

## 2023-06-17 LAB — BASIC METABOLIC PANEL
BUN/Creatinine Ratio: 17 (ref 10–24)
BUN: 17 mg/dL (ref 8–27)
CO2: 25 mmol/L (ref 20–29)
Calcium: 8.9 mg/dL (ref 8.6–10.2)
Chloride: 99 mmol/L (ref 96–106)
Creatinine, Ser: 1.03 mg/dL (ref 0.76–1.27)
Glucose: 88 mg/dL (ref 70–99)
Potassium: 4.3 mmol/L (ref 3.5–5.2)
Sodium: 138 mmol/L (ref 134–144)
eGFR: 76 mL/min/{1.73_m2} (ref >59)

## 2023-06-17 NOTE — Progress Notes (Signed)
Cardiology Office Note   Date:  06/17/2023   ID:  [name], Donald Welch 08/18/1951, MRN [id number]  PCP:  Donald Mylar, MD    No chief complaint on file.  Chest pain  Wt Readings from Last 3 Encounters:  06/17/23 198 lb 9.6 oz (90.1 kg)  05/13/19 182 lb (82.6 kg)  04/08/19 182 lb (82.6 kg)       History of Present Illness: Donald Welch is a 72 y.o. male with a history of hyperlipidemia, asymptomatic bradycardia who is being seen today for the evaluation of chest discomfort at the request of Donald Mylar, MD. memory deficit is also noted in his history from primary care doctor.  Seen at [facility] in May 2024 with the following history: "c/o on and off chest pain, seen by another provider last week and was referred to cardiology, has not heard yet.  He admits that about a month ago, he ran into something with the left side of his chest, denies bruising, the side of chest gets tender with pressure.  His chest pain he describes as tightness, only feels this when under stress, denies depression, admits that his wife can be 'challenging at times' denies SOB/tingling down his arm, he admits occasional palpitations. "  He thinks sx started a year ago during a stressful situation.  He stays active without any exertional symptoms. Sx have been infrequent, and related to emotional stress.  He does not remember the details of the chest trauma in May 2024.  No rib fractures pre his report.  Sx would last miniutes.  Sx resolve with "calm down and walk away."  Uncle on mother's side who had MI. Several sibling, but no one with CAD.  He does not recall a recent stress test.    Denies :  Dizziness. Leg edema. Nitroglycerin use. Orthopnea. Palpitations. Paroxysmal nocturnal dyspnea. Shortness of breath. Syncope.    Past Medical History:  Diagnosis Date   Allergy    Arthritis    Cataracts, bilateral    Diverticulosis    Gastritis    GERD (gastroesophageal reflux disease)    Headache  syndrome 01/16/2017   Hiatal hernia     Past Surgical History:  Procedure Laterality Date   APPENDECTOMY     COLONOSCOPY     FACIAL FRACTURE SURGERY  02/2011   has steel plates    HERNIA REPAIR  2008 or 2009   inguinal hernia/right   TUMOR REMOVAL  1956   Right lower jaw/ 72 yrs old/ benign   UPPER GASTROINTESTINAL ENDOSCOPY       Current Outpatient Medications  Medication Sig Dispense Refill   acetaminophen (TYLENOL) 325 MG tablet Take 650 mg by mouth as needed. For headache     diclofenac (VOLTAREN) 50 MG EC tablet Take 25 mg by mouth as needed. pain      omeprazole (PRILOSEC) 20 MG capsule Take 20 mg by mouth 2 (two) times daily at 8 am and 10 pm. May have 1 refill     No current facility-administered medications for this visit.    Allergies:   Penicillins    Social History:  The patient  reports that he has quit smoking. He quit smokeless tobacco use about 34 years ago.  His smokeless tobacco use included chew. He reports current alcohol use. He reports that he does not use drugs.   Family History:  The patient's family history includes Clotting disorder in his father; Depression in his mother.    ROS:  Please see the history of present illness.   Otherwise, review of systems are positive for chest pain.   All other systems are reviewed and negative.    PHYSICAL EXAM: VS:  BP 124/80   Pulse (!) 58   Ht 6\' 1"  (1.854 m)   Wt 198 lb 9.6 oz (90.1 kg)   SpO2 98%   BMI 26.20 kg/m  , BMI Body mass index is 26.2 kg/m. GEN: Well nourished, well developed, in no acute distress HEENT: normal Neck: no JVD, carotid bruits, or masses Cardiac: RRR; no murmurs, rubs, or gallops,no edema  Respiratory:  clear to auscultation bilaterally, normal work of breathing GI: soft, nontender, nondistended, + BS MS: no deformity or atrophy Skin: warm and dry, no rash Neuro:  Strength and sensation are intact Psych: euthymic mood, full affect   EKG:   The ekg ordered today demonstrates  NSR, inferior Q waves with T wave inversion   Recent Labs: No results found for requested labs within last 365 days.   Lipid Panel No results found for: "CHOL", "TRIG", "HDL", "CHOLHDL", "VLDL", "LDLCALC", "LDLDIRECT"   Other studies Reviewed: Additional studies/ records that were reviewed today with results demonstrating: Normal renal function in 2023, LDL 154 in February 2024.   ASSESSMENT AND PLAN:  Chest discomfort: Initially, there was some concern for traumatic injury.  He had rib x-rays.  He did bump into something.  Age and hyperlipidemia risk factors for heart disease.  No recent sx; last episode was a few months ago. Plan for CTA coronaries.  Hyperlipidemia: LDL 154 in 12/2022.  He has not been on cholesterol lowering medicine.  May need lipid-lowering therapy depending on results of CTA coronaries Former smoker: No AAA by 2020 u/s. Bradycardia: resting HR < 60.  Today HR 47 on ECG.  No additional metoprolol for CTA.   Current medicines are reviewed at length with the patient today.  The patient concerns regarding his medicines were addressed.  The following changes have been made:  No change  Labs/ tests ordered today include:   Orders Placed This Encounter  Procedures   EKG 12-Lead    Recommend 150 minutes/week of aerobic exercise Low fat, low carb, high fiber diet recommended  Disposition:   FU in based on CT results   Signed, Lance Muss, MD  06/17/2023 9:27 AM    [facility] [address] [place], [place], [place]  [address] Phone: [phone]; Fax: [phone]

## 2023-06-17 NOTE — Patient Instructions (Addendum)
Medication Instructions:  Your physician recommends that you continue on your current medications as directed. Please refer to the Current Medication list given to you today.  *If you need a refill on your cardiac medications before your next appointment, please call your pharmacy*   Lab Work: Lab work to be done today--BMP If you have labs (blood work) drawn today and your tests are completely normal, you will receive your results only by: MyChart Message (if you have MyChart) OR A paper copy in the mail If you have any lab test that is abnormal or we need to change your treatment, we will call you to review the results.   Testing/Procedures: Your physician has requested that you have cardiac CT. Cardiac computed tomography (CT) is a painless test that uses an x-ray machine to take clear, detailed pictures of your heart. For further information please visit [email or web address] Please follow instruction sheet as given.     Follow-Up: At [facility], you and your health needs are our priority.  As part of our continuing mission to provide you with exceptional heart care, we have created designated Provider Care Teams.  These Care Teams include your primary Cardiologist (physician) and Advanced Practice Providers (APPs -  Physician Assistants and Nurse Practitioners) who all work together to provide you with the care you need, when you need it.  We recommend signing up for the patient portal called "MyChart".  Sign up information is provided on this After Visit Summary.  MyChart is used to connect with patients for Virtual Visits (Telemedicine).  Patients are able to view lab/test results, encounter notes, upcoming appointments, etc.  Non-urgent messages can be sent to your provider as well.   To learn more about what you can do with MyChart, go to [email or web address].    Your next appointment:   Based on results  Provider:   Lance Muss, MD     Other Instructions     Your cardiac CT will be scheduled at one of the below locations:   [facility] [address] [place], [place] [address] [phone]  OR  [facility] [address] [place], [place] [address] [phone]  OR   [facility] [address] [place], [place] [address] [phone]  If scheduled at [facility], please arrive at the [facility] and Children's Entrance (Entrance C2) of [facility] 30 minutes prior to test start time. You can use the FREE valet parking offered at entrance C (encouraged to control the heart rate for the test)  Proceed to the [facility] Radiology Department (first floor) to check-in and test prep.  All radiology patients and guests should use entrance C2 at [facility], accessed from [facility], even though the hospital's physical address listed is [address].    If scheduled at [facility] or [facility] please arrive 15 mins early for check-in and test prep.  There is spacious parking and easy access to the radiology department from the [facility] Heart and Vascular entrance. Please enter here and check-in with the desk attendant.   Please follow these instructions carefully (unless otherwise directed):  An IV will be required for this test and Nitroglycerin will be given.  Hold all erectile dysfunction medications at least 3 days (72 hrs) prior to test. (Ie viagra, cialis, sildenafil, tadalafil, etc)   On the Night Before the Test: Be sure to Drink plenty of  water. Do not consume any caffeinated/decaffeinated beverages or chocolate 12 hours prior to your test. Do not take any antihistamines 12 hours prior to your test.   On the Day of the Test: Drink plenty of water until 1 hour prior to the test. Do not eat any food 1 hour prior to test. You may take your regular  medications prior to the test.  If you take Furosemide/Hydrochlorothiazide/Spironolactone, please HOLD on the morning of the test.          After the Test: Drink plenty of water. After receiving IV contrast, you may experience a mild flushed feeling. This is normal. On occasion, you may experience a mild rash up to 24 hours after the test. This is not dangerous. If this occurs, you can take Benadryl 25 mg and increase your fluid intake. If you experience trouble breathing, this can be serious. If it is severe call 911 IMMEDIATELY. If it is mild, please call our office. If you take any of these medications: Glipizide/Metformin, Avandament, Glucavance, please do not take 48 hours after completing test unless otherwise instructed.  We will call to schedule your test 2-4 weeks out understanding that some insurance companies will need an authorization prior to the service being performed.   For more information and frequently asked questions, please visit our website : [email or web address]  For non-scheduling related questions, please contact the cardiac imaging nurse navigator should you have any questions/concerns: Cardiac Imaging Nurse Navigators Direct Office Dial: [phone]   For scheduling needs, including cancellations and rescheduling, please call [place], [phone].

## 2023-06-20 ENCOUNTER — Encounter (HOSPITAL_COMMUNITY): Payer: Self-pay

## 2023-06-24 ENCOUNTER — Telehealth (HOSPITAL_COMMUNITY): Payer: Self-pay | Admitting: *Deleted

## 2023-06-24 NOTE — Telephone Encounter (Signed)
Reaching out to patient to offer assistance regarding upcoming cardiac imaging study; pt verbalizes understanding of appt date/time, parking situation and where to check in, pre-test NPO status and medications ordered, and verified current allergies; name and call back number provided for further questions should they arise Johney Frame RN Navigator Cardiac Imaging [facility] [phone] office [phone] cell    Patient out of town and request to reschedule.  Appt rescheduled for 8/22.

## 2023-06-25 ENCOUNTER — Ambulatory Visit (HOSPITAL_COMMUNITY): Payer: Medicare Other

## 2023-06-26 ENCOUNTER — Ambulatory Visit (HOSPITAL_COMMUNITY)
Admission: RE | Admit: 2023-06-26 | Discharge: 2023-06-26 | Disposition: A | Discharge status: Home / Self Care | Payer: Medicare Other | Source: Ambulatory Visit | Attending: Interventional Cardiology | Admitting: Interventional Cardiology

## 2023-06-26 DIAGNOSIS — Z23 Encounter for immunization: Secondary | ICD-10-CM | POA: Diagnosis not present

## 2023-06-26 DIAGNOSIS — R931 Abnormal findings on diagnostic imaging of heart and coronary circulation: Secondary | ICD-10-CM | POA: Insufficient documentation

## 2023-06-26 DIAGNOSIS — E785 Hyperlipidemia, unspecified: Secondary | ICD-10-CM | POA: Diagnosis not present

## 2023-06-26 DIAGNOSIS — N529 Male erectile dysfunction, unspecified: Secondary | ICD-10-CM | POA: Diagnosis not present

## 2023-06-26 DIAGNOSIS — I2510 Atherosclerotic heart disease of native coronary artery without angina pectoris: Secondary | ICD-10-CM

## 2023-06-26 DIAGNOSIS — R072 Precordial pain: Secondary | ICD-10-CM | POA: Diagnosis not present

## 2023-06-26 DIAGNOSIS — Z125 Encounter for screening for malignant neoplasm of prostate: Secondary | ICD-10-CM | POA: Diagnosis not present

## 2023-06-26 DIAGNOSIS — H9193 Unspecified hearing loss, bilateral: Secondary | ICD-10-CM | POA: Diagnosis not present

## 2023-06-26 DIAGNOSIS — R413 Other amnesia: Secondary | ICD-10-CM | POA: Diagnosis not present

## 2023-06-26 DIAGNOSIS — Z0000 Encounter for general adult medical examination without abnormal findings: Secondary | ICD-10-CM | POA: Diagnosis not present

## 2023-06-26 DIAGNOSIS — Z1159 Encounter for screening for other viral diseases: Secondary | ICD-10-CM | POA: Diagnosis not present

## 2023-06-26 MED ORDER — NITROGLYCERIN 0.4 MG SL SUBL
SUBLINGUAL_TABLET | SUBLINGUAL | Status: AC
  Filled 2023-06-26: qty 2

## 2023-06-26 MED ORDER — IOHEXOL 350 MG/ML SOLN
100.0000 mL | Freq: Once | INTRAVENOUS | Status: AC | PRN
  Administered 2023-06-26: 100 mL via INTRAVENOUS

## 2023-06-26 MED ORDER — NITROGLYCERIN 0.4 MG SL SUBL
0.8000 mg | SUBLINGUAL_TABLET | Freq: Once | SUBLINGUAL | Status: AC
  Administered 2023-06-26: 0.8 mg via SUBLINGUAL

## 2023-06-27 LAB — LAB REPORT - SCANNED
EGFR: 70
HM Hepatitis Screen: NEGATIVE

## 2023-06-30 ENCOUNTER — Ambulatory Visit (HOSPITAL_BASED_OUTPATIENT_CLINIC_OR_DEPARTMENT_OTHER)
Admission: RE | Admit: 2023-06-30 | Discharge: 2023-06-30 | Disposition: A | Discharge status: Home / Self Care | Payer: Medicare Other | Source: Ambulatory Visit | Attending: Cardiology | Admitting: Cardiology

## 2023-06-30 ENCOUNTER — Other Ambulatory Visit (HOSPITAL_COMMUNITY): Payer: Self-pay | Admitting: Emergency Medicine

## 2023-06-30 DIAGNOSIS — I2510 Atherosclerotic heart disease of native coronary artery without angina pectoris: Secondary | ICD-10-CM

## 2023-06-30 DIAGNOSIS — R931 Abnormal findings on diagnostic imaging of heart and coronary circulation: Secondary | ICD-10-CM

## 2023-06-30 DIAGNOSIS — R072 Precordial pain: Secondary | ICD-10-CM | POA: Diagnosis not present

## 2023-07-01 ENCOUNTER — Telehealth: Payer: Self-pay | Admitting: *Deleted

## 2023-07-01 DIAGNOSIS — E782 Mixed hyperlipidemia: Secondary | ICD-10-CM

## 2023-07-01 NOTE — Telephone Encounter (Signed)
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  Would continue medical therapy given his lack of symptoms at the last visit.  Would start rosuvastatin 20 mg daily.  Lipids and liver test in 3 months.  Would have him follow with an interventionalist in 6 months, sooner if he has any more symptoms.

## 2023-07-02 MED ORDER — ROSUVASTATIN CALCIUM 20 MG PO TABS: 20.0000 mg | ORAL_TABLET | Freq: Every day | ORAL | 3 refills | Status: DC

## 2023-07-02 NOTE — Telephone Encounter (Signed)
Patient notified. Prescription sent to [facility] on [address]  Patient will come in for fasting lab work on November 20.  Will plan on follow up in 6 months at [facility] office.  Patient aware to let us know if he develops any symptoms prior to this appointment.

## 2023-09-24 ENCOUNTER — Ambulatory Visit: Payer: Medicare Other | Attending: Interventional Cardiology

## 2023-09-24 DIAGNOSIS — E782 Mixed hyperlipidemia: Secondary | ICD-10-CM

## 2023-10-17 ENCOUNTER — Telehealth: Payer: Self-pay | Admitting: *Deleted

## 2023-10-17 NOTE — Telephone Encounter (Signed)
Dr Damione Dace had prescribed Rosuvastatin for patient and patient is due for follow up lab work.  Patient has not had lab work done. I spoke with patient.  He has not started Rosuvastatin and thought he was told to take red yeast rice.  He will not start Rosuvastatin at this time and continue red yeast rice.  He will discuss with new provider at follow up appointment.  Appointment made for patient to see Dr Clifton James on Jan 01, 2024

## 2023-12-29 DIAGNOSIS — R931 Abnormal findings on diagnostic imaging of heart and coronary circulation: Secondary | ICD-10-CM | POA: Diagnosis not present

## 2023-12-29 DIAGNOSIS — H9193 Unspecified hearing loss, bilateral: Secondary | ICD-10-CM | POA: Diagnosis not present

## 2023-12-29 DIAGNOSIS — R413 Other amnesia: Secondary | ICD-10-CM | POA: Diagnosis not present

## 2023-12-29 DIAGNOSIS — E785 Hyperlipidemia, unspecified: Secondary | ICD-10-CM | POA: Diagnosis not present

## 2023-12-29 DIAGNOSIS — Z23 Encounter for immunization: Secondary | ICD-10-CM | POA: Diagnosis not present

## 2023-12-31 NOTE — Progress Notes (Unsigned)
 No chief complaint on file.  History of Present Illness: 73 yo male with history of GERD, hyperlipidemia and asymptomatic bradycardia who is here today for follow up. He was seen in August 2024 by Dr. Jaeshawn Dace for evaluation of atypical chest pain that started after he hit his chest wall. No exertional chest pain. Coronary CTA August 2024 with mild to moderate stenosis in the LAD and Circumflex. Severe mid and distal RCA stenosis with severe disease in the PDA and posterolateral artery. Medical management recommended given his lack of symptoms.   He is here today for follow up. The patient denies any chest pain, dyspnea, palpitations, lower extremity edema, orthopnea, PND, dizziness, near syncope or syncope.   Primary Care Physician: Shirlean Mylar, MD   Past Medical History:  Diagnosis Date   Allergy    Arthritis    Cataracts, bilateral    Diverticulosis    Gastritis    GERD (gastroesophageal reflux disease)    Headache syndrome 01/16/2017   Hiatal hernia     Past Surgical History:  Procedure Laterality Date   APPENDECTOMY     COLONOSCOPY     FACIAL FRACTURE SURGERY  02/2011   has steel plates    HERNIA REPAIR  2008 or 2009   inguinal hernia/right   TUMOR REMOVAL  1956   Right lower jaw/ 73 yrs old/ benign   UPPER GASTROINTESTINAL ENDOSCOPY      Current Outpatient Medications  Medication Sig Dispense Refill   acetaminophen (TYLENOL) 325 MG tablet Take 650 mg by mouth as needed. For headache     diclofenac (VOLTAREN) 50 MG EC tablet Take 25 mg by mouth as needed. pain      omeprazole (PRILOSEC) 20 MG capsule Take 20 mg by mouth 2 (two) times daily at 8 am and 10 pm. May have 1 refill     rosuvastatin (CRESTOR) 20 MG tablet Take 1 tablet (20 mg total) by mouth daily. (Patient not taking: Reported on 10/17/2023) 90 tablet 3   No current facility-administered medications for this visit.    Allergies  Allergen Reactions   Penicillins     Other reaction(s): Other (See  Comments) Childhood allergy  Childhood allergy     Social History   Socioeconomic History   Marital status: Married    Spouse name: Liborio Nixon   Number of children: 2   Years of education: 13   Highest education level: Not on file  Occupational History   Occupation: retired  Tobacco Use   Smoking status: Former   Smokeless tobacco: Former    Types: Chew    Quit date: 04/07/1989  Vaping Use   Vaping status: Never Used  Substance and Sexual Activity   Alcohol use: Yes    Comment: occassional   Drug use: No   Sexual activity: Not on file  Other Topics Concern   Not on file  Social History Narrative   Lives with wife, Liborio Nixon   Caffeine use: 2.5 cups coffee per day or more   Right-handed   Social Drivers of Corporate investment banker Strain: Not on file  Food Insecurity: Not on file  Transportation Needs: Not on file  Physical Activity: Not on file  Stress: Not on file  Social Connections: Not on file  Intimate Partner Violence: Not on file    Family History  Problem Relation Age of Onset   Depression Mother    Clotting disorder Father    Colon cancer Neg Hx    Rectal  cancer Neg Hx    Esophageal cancer Neg Hx    Stomach cancer Neg Hx    Liver cancer Neg Hx    Inflammatory bowel disease Neg Hx     Review of Systems:  As stated in the HPI and otherwise negative.   There were no vitals taken for this visit.  Physical Examination: General: Well developed, well nourished, NAD  HEENT: OP clear, mucus membranes moist  SKIN: warm, dry. No rashes. Neuro: No focal deficits  Musculoskeletal: Muscle strength 5/5 all ext  Psychiatric: Mood and affect normal  Neck: No JVD, no carotid bruits, no thyromegaly, no lymphadenopathy.  Lungs:Clear bilaterally, no wheezes, rhonci, crackles Cardiovascular: Regular rate and rhythm. No murmurs, gallops or rubs. Abdomen:Soft. Bowel sounds present. Non-tender.  Extremities: No lower extremity edema. Pulses are 2 + in the bilateral  DP/PT.  EKG:  EKG {ACTION; IS/IS [id number]} ordered today. The ekg ordered today demonstrates ***  Recent Labs: 06/17/2023: BUN 17; Creatinine, Ser 1.03; Potassium 4.3; Sodium 138   Lipid Panel No results found for: "CHOL", "TRIG", "HDL", "CHOLHDL", "VLDL", "LDLCALC", "LDLDIRECT"   Wt Readings from Last 3 Encounters:  06/17/23 90.1 kg  05/13/19 82.6 kg  04/08/19 82.6 kg      Assessment and Plan:   1. CAD without angina: *** Continue ASA and statin. LDL ***.   Labs/ tests ordered today include:  No orders of the defined types were placed in this encounter.    Disposition:   F/U with me in ***    Signed, Verne Carrow, MD, [facility] 12/31/2023 11:18 AM    [facility] [address] [place], [place], [place]  [address] Phone: [phone]; Fax: [phone]

## 2024-01-01 ENCOUNTER — Encounter: Payer: Self-pay | Admitting: Cardiovascular Disease

## 2024-01-01 ENCOUNTER — Ambulatory Visit: Payer: Medicare Other | Attending: Cardiovascular Disease | Admitting: Cardiovascular Disease

## 2024-01-01 VITALS — BP 120/62 | HR 54 | Ht 73.0 in | Wt 197.8 lb

## 2024-01-01 DIAGNOSIS — E782 Mixed hyperlipidemia: Secondary | ICD-10-CM | POA: Diagnosis not present

## 2024-01-01 DIAGNOSIS — I2510 Atherosclerotic heart disease of native coronary artery without angina pectoris: Secondary | ICD-10-CM | POA: Insufficient documentation

## 2024-01-01 MED ORDER — ASPIRIN 81 MG PO TBEC: 81.0000 mg | DELAYED_RELEASE_TABLET | Freq: Every day | ORAL | Status: AC

## 2024-01-01 MED ORDER — ROSUVASTATIN CALCIUM 20 MG PO TABS: 20.0000 mg | ORAL_TABLET | Freq: Every day | ORAL | 3 refills | Status: DC

## 2024-01-01 NOTE — Patient Instructions (Addendum)
 Medication Instructions:  Your physician has recommended you make the following change in your medication:   1) START aspirin 81 mg daily 2) START rosuvastatin (Crestor) 20 mg daily  *If you need a refill on your cardiac medications before your next appointment, please call your pharmacy*  Lab Work: In 12 weeks go to [facility]: Lipid panel, LFTs You may go to any of these [facility] locations:   [facility] - [address] ([facility]) - [address] [phone] [address]   [facility] - [address] - [address] [employer] [address]  If you have labs (blood work) drawn today and your tests are completely normal, you will receive your results only by: [employer] (if you have MyChart) OR A paper copy in the mail If you have any lab test that is abnormal or we need to change your treatment, we will call you to review the results.  Follow-Up: At [facility], you and your health needs are our priority.  As part of our continuing mission to provide you with exceptional heart care, we have created designated Provider Care Teams.  These Care Teams include your primary Cardiologist (physician) and Advanced Practice Providers (APPs -  Physician Assistants and Nurse Practitioners) who all work together to provide you with the care you need, when you need it.  Your next appointment:   1 year(s)  The format for your next appointment:   In Person  Provider:   Verne Carrow, MD {  Other Instructions   1st Floor: - Lobby - Registration  - Pharmacy  - Lab - Cafe  2nd Floor: - PV Lab - Diagnostic Testing (echo, CT, nuclear med)  3rd Floor: - Vacant  4th Floor: - TCTS (cardiothoracic surgery) - AFib Clinic - Structural Heart Clinic - Vascular Surgery  - Vascular Ultrasound  5th Floor: - HeartCare Cardiology (general and EP) - Clinical Pharmacy for coumadin, hypertension, lipid, weight-loss medications, and  med management appointments    Valet parking services will be available as well.

## 2024-06-10 ENCOUNTER — Ambulatory Visit
Admission: RE | Admit: 2024-06-10 | Discharge: 2024-06-10 | Disposition: A | Discharge status: Home / Self Care | Source: Ambulatory Visit | Attending: Otolaryngology | Admitting: Otolaryngology

## 2024-06-10 ENCOUNTER — Other Ambulatory Visit: Payer: Self-pay | Admitting: Otolaryngology

## 2024-06-10 DIAGNOSIS — R053 Chronic cough: Secondary | ICD-10-CM | POA: Diagnosis not present

## 2024-07-02 ENCOUNTER — Emergency Department (HOSPITAL_COMMUNITY)
Admission: EM | Admit: 2024-07-02 | Discharge: 2024-07-02 | Disposition: A | Discharge status: Home / Self Care | Source: Home / Self Care

## 2024-07-13 DIAGNOSIS — Z23 Encounter for immunization: Secondary | ICD-10-CM | POA: Diagnosis not present

## 2024-07-13 DIAGNOSIS — Z125 Encounter for screening for malignant neoplasm of prostate: Secondary | ICD-10-CM | POA: Diagnosis not present

## 2024-07-13 DIAGNOSIS — N529 Male erectile dysfunction, unspecified: Secondary | ICD-10-CM | POA: Diagnosis not present

## 2024-07-13 DIAGNOSIS — H6123 Impacted cerumen, bilateral: Secondary | ICD-10-CM | POA: Diagnosis not present

## 2024-07-13 DIAGNOSIS — H9193 Unspecified hearing loss, bilateral: Secondary | ICD-10-CM | POA: Diagnosis not present

## 2024-07-13 DIAGNOSIS — R931 Abnormal findings on diagnostic imaging of heart and coronary circulation: Secondary | ICD-10-CM | POA: Diagnosis not present

## 2024-07-13 DIAGNOSIS — E785 Hyperlipidemia, unspecified: Secondary | ICD-10-CM | POA: Diagnosis not present

## 2024-07-13 DIAGNOSIS — R413 Other amnesia: Secondary | ICD-10-CM | POA: Diagnosis not present

## 2024-07-13 DIAGNOSIS — Z0000 Encounter for general adult medical examination without abnormal findings: Secondary | ICD-10-CM | POA: Diagnosis not present

## 2024-07-13 DIAGNOSIS — Z1331 Encounter for screening for depression: Secondary | ICD-10-CM | POA: Diagnosis not present

## 2024-07-26 ENCOUNTER — Encounter: Payer: Self-pay | Admitting: Emergency Medicine

## 2024-07-26 ENCOUNTER — Ambulatory Visit: Attending: Emergency Medicine | Admitting: Emergency Medicine

## 2024-07-26 VITALS — BP 130/72 | Ht 73.0 in | Wt 189.0 lb

## 2024-07-26 DIAGNOSIS — E782 Mixed hyperlipidemia: Secondary | ICD-10-CM | POA: Insufficient documentation

## 2024-07-26 DIAGNOSIS — I2510 Atherosclerotic heart disease of native coronary artery without angina pectoris: Secondary | ICD-10-CM | POA: Diagnosis not present

## 2024-07-26 DIAGNOSIS — R001 Bradycardia, unspecified: Secondary | ICD-10-CM | POA: Diagnosis present

## 2024-07-26 MED ORDER — ROSUVASTATIN CALCIUM 40 MG PO TABS: 40.0000 mg | ORAL_TABLET | Freq: Every day | ORAL | 3 refills | Status: AC

## 2024-07-26 NOTE — Progress Notes (Signed)
 Cardiology Office Note:    Date:  07/26/2024  ID:  Donald Welch, DOB May 13, 1951, MRN [id number] PCP: Douglass Ivanoff, MD  [facility] Providers Cardiologist:  Lonni Cash, MD Cardiology APP:  Rana Lum CROME, NP       Patient Profile:       Chief Complaint: Follow-up for hyperlipidemia History of Present Illness:  Donald Welch is a 73 y.o. male with visit-pertinent history of GERD, hyperlipidemia, asymptomatic bradycardia  He established with cardiology service in August 2024 by Dr. Dann for evaluation of atypical chest pain that started after he hit his chest wall.  He denied any exertional chest pains.  Coronary CTA August 2024 with mild to moderate stenosis in the LAD and circumflex.  Severe mid and distal RCA stenosis with severe disease in the PDA and posterior lateral artery.  Medical management was recommended given lack of symptoms.  He was last seen in clinic on 01/01/2024 by Dr. Cash.  He was without any exertional symptoms and remained very active.  His CAD would be continued to manage medically.  He was started on aspirin  81 mg daily and Crestor  20 mg daily.   Discussed the use of AI scribe software for clinical note transcription with the patient, who gave verbal consent to proceed.  History of Present Illness Donald Welch is a 73 year old male with coronary artery disease who presents for evaluation of high cholesterol and coronary artery disease he was referred by his primary care provider for evaluation of high cholesterol.  Today he is without any acute cardiac concerns.  Denies chest pains or dyspnea.  He is on rosuvastatin  and aspirin , although he has not been consistently taking aspirin .  At first he reported adherence rosuvastatin  daily, however, upon further review he is really unsure if he is taking this daily.  His LDL cholesterol has been drawn at his PCP office at [facility] which remains elevated at 145 mg/dL in  February and 837 mg/dL in September just 1 week ago.   He maintains an active lifestyle, spending most of his day on his feet, working in his barn, or doing yard work, without experiencing chest pain or shortness of breath.  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  He tells me he is always up and moving and rarely sits.  He avoids activities like biking due to a history of head injuries.    Review of systems:  Please see the history of present illness. All other systems are reviewed and otherwise negative.      Studies Reviewed:    EKG Interpretation Date/Time:  Monday July 26 2024 10:14:47 EDT Ventricular Rate:  48 PR Interval:  224 QRS Duration:  82 QT Interval:  476 QTC Calculation: 425 R Axis:   9  Text Interpretation: Sinus bradycardia with 1st degree A-V block When compared with ECG of 17-Jun-2023 09:17, T wave inversion no longer evident in Inferior leads Confirmed by Rana Lum [phone]) on 07/26/2024 10:21:22 AM    Coronary CTA 06/26/2023 1. Coronary artery calcium  score 2536 Agatston units. This places the patient in the 93rd percentile for age and gender, suggesting high risk for future cardiac events.   2. Very extensive RCA disease. There appears to be severe stenosis in the mid and distal RCA as well as in the ostial PDA and the proximal PLV.   3. Extensive plaque in the proximal to mid LAD. Probably no more than 50% stenosis in  the mid LAD.  FFR analysis 1. Severe, hemodynamically significant stenosis in the mid RCA. However, there appears to be diffuse disease throughout the RCA beyond the mid vessel into its major branches.  Risk Assessment/Calculations:              Physical Exam:   VS:  BP 130/72 (BP Location: Right Arm, Patient Position: Sitting, Cuff Size: Normal)   Ht 6' 1 (1.854 m)   Wt 189 lb (85.7 kg)   SpO2 96%   BMI 24.94 kg/m    Wt Readings from Last 3 Encounters:  07/26/24 189 lb (85.7 kg)   01/01/24 197 lb 12.8 oz (89.7 kg)  06/17/23 198 lb 9.6 oz (90.1 kg)    GEN: Well nourished, well developed in no acute distress NECK: No JVD; No carotid bruits CARDIAC: RRR, no murmurs, rubs, gallops RESPIRATORY:  Clear to auscultation without rales, wheezing or rhonchi  ABDOMEN: Soft, non-tender, non-distended EXTREMITIES:  No edema; No acute deformity      Assessment and Plan:  Hyperlipidemia LDL 162 on 07/2024 and 145 on 12/2023 He was started on rosuvastatin  on 12/2023 - As noted his LDL has increased over the past 6 months.  At first he tells me he is adherent to medication regimen and upon further review it suggest he does frequently miss dosing. 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 - Likely his increase in LDL caused from his memory impairment causing nonadherence to medication regimen - Had long discussion with patient's wife today who will begin to help administer his medications daily at nighttime to improve adherence - Will increase his rosuvastatin  to 40 mg daily - Repeat fasting lipid panel/LFTs in 8 weeks to reevaluate  Coronary artery disease Coronary CTA 06/2023 with CAC of 2536 (93rd percentile) with FFR showing severe hemodynamically significant stenosis in the mid RCA with diffuse disease throughout the RCA beyond the mid vessel and into its major branches Given he was asymptomatic, Dr. Dann managed his CAD medically - EKG was without acute ischemic changes - Today he remains stable without anginal symptoms.  Remains very active walking daily and performing woodworking daily without exertional symptoms.  No indication for further intervention at this time - Continue rosuvastatin  as noted above in further detail - He has not been taking aspirin .  Will have him begin aspirin  81 mg a day  Bradycardia He has history of asymptomatic bradycardia His heart rate today is 48 bpm on EKG - He remains without lightheadedness, dizziness, syncope or  presyncope - Continue to clinically monitor      Dispo:  Return in about 3 months (around 10/25/2024).  Signed, Lum LITTIE Louis, NP

## 2024-07-26 NOTE — Patient Instructions (Signed)
 Medication Instructions:  INCREASE YOUR ROSUVASTATIN  TO 40 MG DAILY. PLEASE START TAKING ASPIRIN  81 MG DAILY. CONTINUE ALL OTHER MEDICATION THERAPY.  Lab Work: LFTs AND FASTING LIPID PANEL TO BE DONE IN 8 WEEKS. (09-25-24)  Testing/Procedures: NONE  Follow-Up: At [facility], you and your health needs are our priority.  As part of our continuing mission to provide you with exceptional heart care, our providers are all part of one team.  This team includes your primary Cardiologist (physician) and Advanced Practice Providers or APPs (Physician Assistants and Nurse Practitioners) who all work together to provide you with the care you need, when you need it.  Your next appointment:   3 MONTHS  Provider:   Lonni Cash, MD OR Lum Louis, NP

## 2024-09-01 DIAGNOSIS — E785 Hyperlipidemia, unspecified: Secondary | ICD-10-CM | POA: Diagnosis not present

## 2024-09-01 DIAGNOSIS — R413 Other amnesia: Secondary | ICD-10-CM | POA: Diagnosis not present

## 2024-09-06 ENCOUNTER — Other Ambulatory Visit: Payer: Self-pay | Admitting: Family Medicine

## 2024-09-06 DIAGNOSIS — R413 Other amnesia: Secondary | ICD-10-CM

## 2024-10-07 ENCOUNTER — Other Ambulatory Visit

## 2024-10-11 DIAGNOSIS — M79645 Pain in left finger(s): Secondary | ICD-10-CM | POA: Diagnosis not present

## 2024-10-11 DIAGNOSIS — M20012 Mallet finger of left finger(s): Secondary | ICD-10-CM | POA: Diagnosis not present

## 2024-10-26 ENCOUNTER — Ambulatory Visit: Admitting: Emergency Medicine

## 2024-10-26 NOTE — Progress Notes (Deleted)
" °  Cardiology Office Note:    Date:  10/26/2024  ID:  Donald Welch, DOB [birth year], MRN [id number] PCP: Douglass Ivanoff, MD  Donald Welch Cardiologist:  Lonni Cash, MD Cardiology APP:  Rana Lum CROME, NP { Click to update primary MD,subspecialty MD or APP then REFRESH:1}    {Click to Open Review  :1}   Patient Profile:       Chief Complaint: *** History of Present Illness:  Donald Welch is a 73 y.o. male with visit-pertinent history of GERD, hyperlipidemia, asymptomatic bradycardia   He established with cardiology service in August 2024 by Dr. Dann for evaluation of atypical chest pain that started after he hit his chest wall.  He denied any exertional chest pains.  Coronary CTA August 2024 with mild to moderate stenosis in the LAD and circumflex.  Severe mid and distal RCA stenosis with severe disease in the PDA and posterior lateral artery.  Medical management was recommended given lack of symptoms.   Seen on 01/01/2024 by Dr. Cash.  He was without any exertional symptoms and remained very active.  His CAD would be continued to manage medically.  He was started on aspirin  81 mg daily and Crestor  20 mg daily.  Last seen in clinic on 07/26/2024.  He was stable without anginal symptoms.  It was noted he was nonadherent to medication regimen given history of memory impairment.  His wife was going to help administer medicines.  He was started on aspirin  81 mg daily.  Discussed the use of AI scribe software for clinical note transcription with the patient, who gave verbal consent to proceed.  History of Present Illness     Review of systems:  Please see the history of present illness. All other systems are reviewed and otherwise negative. ***      Studies Reviewed:        ***  Risk Assessment/Calculations:   {Does this patient have ATRIAL FIBRILLATION?:[id number]} No BP recorded.  {Refresh Note OR Click here to enter BP  :1}***         Physical Exam:   VS:  There were no vitals taken for this visit.   Wt Readings from Last 3 Encounters:  07/26/24 189 lb (85.7 kg)  01/01/24 197 lb 12.8 oz (89.7 kg)  06/17/23 198 lb 9.6 oz (90.1 kg)    GEN: Well nourished, well developed in no acute distress NECK: No JVD; No carotid bruits CARDIAC: ***RRR, no murmurs, rubs, gallops RESPIRATORY:  Clear to auscultation without rales, wheezing or rhonchi  ABDOMEN: Soft, non-tender, non-distended EXTREMITIES:  No edema; No acute deformity ***      Assessment and Plan:    Assessment and Plan Assessment & Plan      {Are you ordering a CV Procedure (e.g. stress test, cath, DCCV, TEE, etc)?   Press F2        :[id number]}  Dispo:  No follow-ups on file.  Signed, Lum CROME Rana, NP  "

## 2024-12-15 ENCOUNTER — Ambulatory Visit: Admitting: Podiatry

## 2024-12-23 ENCOUNTER — Other Ambulatory Visit

## 2025-01-19 ENCOUNTER — Ambulatory Visit: Admitting: Neurology
